# Patient Record
Sex: Male | Born: 1945 | Race: Black or African American | Hispanic: No | Smoking: Former smoker
Health system: Southern US, Community
[De-identification: ages and names within clinical notes are randomized; demographics above are authoritative.]

## PROBLEM LIST (undated history)

## (undated) DIAGNOSIS — N200 Calculus of kidney: Secondary | ICD-10-CM

## (undated) DIAGNOSIS — I10 Essential (primary) hypertension: Secondary | ICD-10-CM

## (undated) HISTORY — PX: CHOLECYSTECTOMY: SHX55

## (undated) HISTORY — DX: Morbid (severe) obesity due to excess calories: E66.01

## (undated) HISTORY — DX: Essential (primary) hypertension: I10

## (undated) HISTORY — DX: Calculus of kidney: N20.0

---

## 2007-04-13 ENCOUNTER — Ambulatory Visit: Payer: Self-pay | Admitting: Internal Medicine

## 2008-04-11 ENCOUNTER — Inpatient Hospital Stay: Payer: Self-pay | Admitting: Surgery

## 2009-04-19 DIAGNOSIS — R0602 Shortness of breath: Secondary | ICD-10-CM

## 2009-04-29 ENCOUNTER — Ambulatory Visit: Payer: Self-pay | Admitting: Internal Medicine

## 2009-04-29 DIAGNOSIS — E785 Hyperlipidemia, unspecified: Secondary | ICD-10-CM

## 2009-04-29 DIAGNOSIS — R609 Edema, unspecified: Secondary | ICD-10-CM | POA: Insufficient documentation

## 2009-04-29 DIAGNOSIS — I10 Essential (primary) hypertension: Secondary | ICD-10-CM | POA: Insufficient documentation

## 2009-04-29 DIAGNOSIS — E782 Mixed hyperlipidemia: Secondary | ICD-10-CM | POA: Insufficient documentation

## 2009-04-29 DIAGNOSIS — R079 Chest pain, unspecified: Secondary | ICD-10-CM | POA: Insufficient documentation

## 2009-04-29 LAB — CONVERTED CEMR LAB
CO2: 22 meq/L (ref 19–32)
Calcium: 9.4 mg/dL (ref 8.4–10.5)
Sodium: 139 meq/L (ref 135–145)

## 2009-04-30 ENCOUNTER — Encounter: Payer: Self-pay | Admitting: Internal Medicine

## 2009-05-07 ENCOUNTER — Encounter: Payer: Self-pay | Admitting: Cardiovascular Disease

## 2009-05-07 ENCOUNTER — Encounter: Payer: Self-pay | Admitting: Internal Medicine

## 2009-05-07 ENCOUNTER — Ambulatory Visit: Payer: Self-pay | Admitting: Internal Medicine

## 2009-05-07 ENCOUNTER — Ambulatory Visit: Payer: Self-pay

## 2009-07-01 ENCOUNTER — Ambulatory Visit: Payer: Self-pay | Admitting: Cardiovascular Disease

## 2009-08-05 ENCOUNTER — Telehealth: Payer: Self-pay | Admitting: Cardiovascular Disease

## 2009-08-06 ENCOUNTER — Ambulatory Visit: Payer: Self-pay | Admitting: Cardiovascular Disease

## 2009-08-09 LAB — CONVERTED CEMR LAB
ALT: 16 units/L (ref 0–53)
AST: 16 units/L (ref 0–37)
Alkaline Phosphatase: 77 units/L (ref 39–117)
Cholesterol: 117 mg/dL (ref 0–200)
Indirect Bilirubin: 0.4 mg/dL (ref 0.0–0.9)
Total Protein: 7.4 g/dL (ref 6.0–8.3)
Triglycerides: 90 mg/dL (ref <150)

## 2009-08-30 ENCOUNTER — Ambulatory Visit: Payer: Self-pay | Admitting: Otolaryngology

## 2009-09-19 ENCOUNTER — Ambulatory Visit: Payer: Self-pay | Admitting: Otolaryngology

## 2009-11-06 ENCOUNTER — Telehealth: Payer: Self-pay | Admitting: Cardiovascular Disease

## 2010-02-11 NOTE — Assessment & Plan Note (Signed)
Summary: F2M/AMD   Visit Type:  Follow-up Primary Provider:  Toy Cookey  CC:  no complaints.  History of Present Illness: 65 y/o male with h/o morbid obesity, HTN, diabetes (x 12 years), former smoker and GERD, h/o chest pain, Who completed a treadmill test, achieved 160 beats per minute with no significant ST or T wave changes, negative for ischemia.  He also had an echocardiogram that showed normal systolic function, no significant valvular disease. He does have diastolic dysfunction.  He reports that he continues to have lower extremity edema. He takes Lasix quite frequently but not every day for his edema and swelling in his abdomen. He wonders if one of the blood pressure medications,valturna, may be causing the abdominal swelling. He does drink a significant amount of water during the day as he works in a warehouse and sweats all day long.  Snores loudly. No witnessed apnea. Never been tested for OSA.    Current Medications (verified): 1)  Valturna 150-160 Mg Tabs (Aliskiren-Valsartan) .... Take 1 By Mouth Once Daily 2)  Triamterene-Hctz 37.5-25 Mg Tabs (Triamterene-Hctz) .... Take 1 By Mouth Once Daily 3)  Glipizide 10 Mg Xr24h-Tab (Glipizide) .... Take 1 By Mouth Once Daily 4)  Probenecid 500 Mg Tabs (Probenecid) .... Take 1 By Mouth Two Times A Day 5)  Indomethacin 50 Mg Caps (Indomethacin) .... Take 1 By Mouth Three Times A Day 6)  Lasix 20 Mg Tabs (Furosemide) .... Take 1 By Mouth Once Daily As Needed 7)  Potassium Chloride Crys Cr 20 Meq Cr-Tabs (Potassium Chloride Crys Cr) .... Take One Tablet By Mouth Daily As Needed 8)  Zocor 20 Mg Tabs (Simvastatin) .... Take 1 By Mouth Once Daily 9)  Aspirin 81 Mg Tbec (Aspirin) .... Take One Tablet By Mouth Daily  Allergies (verified): 1)  ! Sulfa  Review of Systems       The patient complains of peripheral edema.  The patient denies fever, weight loss, weight gain, vision loss, decreased hearing, hoarseness, chest pain,  syncope, dyspnea on exertion, prolonged cough, abdominal pain, incontinence, muscle weakness, depression, and enlarged lymph nodes.         ABD swelling  Vital Signs:  Patient profile:   65 year old male Height:      74 inches Weight:      304 pounds BMI:     39.17 Pulse rate:   64 / minute BP sitting:   130 / 86  (left arm) Cuff size:   large  Vitals Entered By: Bishop Dublin, CMA (July 01, 2009 9:26 AM)  Physical Exam  General:  Well developed, well nourished, in no acute distress. Head:  normocephalic and atraumatic Neck:  Neck supple, no JVD. No masses, thyromegaly or abnormal cervical nodes. Chest Wall:  no deformities or breast masses noted Lungs:  Clear bilaterally to auscultation and percussion. Heart:  Non-displaced PMI, chest non-tender; regular rate and rhythm, S1, S2 without murmurs, rubs or gallops. Carotid upstroke normal, no bruit.  Pedals normal pulses. No edema, no varicosities. Abdomen:  Bowel sounds positive; abdomen soft and non-tender without masses Msk:  Back normal, normal gait. Muscle strength and tone normal. Pulses:  pulses normal in all 4 extremities Extremities:  No clubbing or cyanosis. Neurologic:  Alert and oriented x 3. Skin:  Intact without lesions or rashes. Psych:  Normal affect.   Impression & Recommendations:  Problem # 1:  EDEMA (ICD-782.3) he continues to have lower extremity edema. I believe this is likely due to mild fluid  overload and possibly the hot weather with mild venous insufficiency. He does drink a significant amount of fluids and I suggested he cut back on his fluids or continue to take his Lasix daily. If he has a high salt and fluid intake on a particular day, he may need Lasix b.i.d. and we will change his prescription.  Problem # 2:  HYPERTENSION, BENIGN (ICD-401.1) his blood pressure is well-controlled and we have given him a co-pay card and some samples of valturna as he reports this was very expensive for him. If he  continues to have abdominal discomfort and he believes it is due to his medication, we could try an alternate medication.  His updated medication list for this problem includes:    Valturna 300-320 Mg Tabs (Aliskiren-valsartan) .Marland Kitchen... Take 1/2-1 tablet by mouth once a day    Triamterene-hctz 37.5-25 Mg Tabs (Triamterene-hctz) .Marland Kitchen... Take 1 by mouth once daily    Lasix 20 Mg Tabs (Furosemide) .Marland Kitchen... Take 1 by mouth once daily as needed    Aspirin 81 Mg Tbec (Aspirin) .Marland Kitchen... Take one tablet by mouth daily  Problem # 3:  HYPERLIPIDEMIA-MIXED (ICD-272.4) He has been on Zocor for 2 months. I suggested we check his cholesterol at the end of July with an LFTs.   His updated medication list for this problem includes:    Zocor 20 Mg Tabs (Simvastatin) .Marland Kitchen... Take 1 by mouth once daily  Patient Instructions: 1)  Your physician has recommended you make the following change in your medication: STOP Valturna 150/160 START Valturna 300/320 take 1/2 tab daily, Lasix 20 two times a day as needed for swelling and Sob, Potassium two times a day as needed when taking lasix 2)  Your physician recommends that you return for a FASTING lipid profile: In July (lipid/liver) 3)  Your physician wants you to follow-up in:   6 months You will receive a reminder letter in the mail two months in advance. If you don't receive a letter, please call our office to schedule the follow-up appointment. Prescriptions: VALTURNA 300-320 MG TABS (ALISKIREN-VALSARTAN) Take 1/2-1 tablet by mouth once a day  #30 x 6   Entered by:   Benedict Needy, RN   Authorized by:   Dossie Arbour MD   Signed by:   Benedict Needy, RN on 07/01/2009   Method used:   Print then Give to Patient   RxID:   [id number]

## 2010-02-11 NOTE — Progress Notes (Signed)
Summary: lab work  Phone Note Call from Patient   Summary of Call: Pt called and said he was told by Dr. Mariah Milling to drop by any day and get a tube of blood drawn.  Planning to come tomorrow.  Does he need to be fasting?  Not on the schedule for tomorrow. Initial call taken by: Park Breed,  August 05, 2009 10:18 AM  Follow-up for Phone Call        pt needs fasting lipid and liver appointment scheduled for 7/26 at 8:30am  Follow-up by: Benedict Needy, RN,  August 05, 2009 10:52 AM

## 2010-02-11 NOTE — Progress Notes (Signed)
Summary: MEDICATION PROBLEMS  Phone Note Call from Patient Call back at Home Phone [phone] Call back at [phone]   Caller: SELF Call For: [facility] Summary of Call: PT IS HAVING MUSCLE ACHES AND SORENESS WITH THE SIMVASTATIN-PT STATES THAT HE IS HAVING DIFFICULTY WALKING Initial call taken by: Harlon Flor,  November 06, 2009 3:54 PM  Follow-up for Phone Call        Pt c/o soreness legs and upper arms. Pt would like to change medications. Please advise. Benedict Needy, RN  November 06, 2009 4:45 PM   Additional Follow-up for Phone Call Additional follow up Details #1::        Could cut simvastatin in 1/2. Could also try crestor 5 mg daily     Appended Document: MEDICATION PROBLEMS    Clinical Lists Changes  Medications: Changed medication from ZOCOR 20 MG TABS (SIMVASTATIN) Take 1 by mouth once daily to ZOCOR 20 MG TABS (SIMVASTATIN) Take 1/2  by mouth once daily

## 2010-02-11 NOTE — Assessment & Plan Note (Signed)
Summary: NP6/AMD   Primary Provider:  Toy Cookey  CC:  Chest pressure x 1 week ago.  History of Present Illness: 65 y/o male with h/o morbid obesity, HTN, diabetes (x 12 years), former smoker and GERD.  Referred for further evaluation of chest pressure.   No h/o known CAD. Never had a stress test or cath.  No signifcant family h/o heart disease.  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  About a week ago had an episode of chest tightness at work while lifting. Lasted most of a day. No assoicated symptoms. Doesn't think he pulled a muscle. Has not recurred. Occasionally SOB when around smokers but otherwise fine. Also reports swelling in face, hands, stomach and legs. Started on maxide without much relief so sometimes takes one of his wife's lasix pills. No orthopnea or PND. Snores loudly. No witnessed apnea. Never been tested for OSA.    Current Medications (verified): 1)  Valturna 150-160 Mg Tabs (Aliskiren-Valsartan) .... Take 1 By Mouth Once Daily 2)  Triamterene-Hctz 37.5-25 Mg Tabs (Triamterene-Hctz) .... Take 1 By Mouth Once Daily 3)  Glipizide 10 Mg Xr24h-Tab (Glipizide) .... Take 1 By Mouth Once Daily 4)  Probenecid 500 Mg Tabs (Probenecid) .... Take 1 By Mouth Two Times A Day 5)  Indomethacin 50 Mg Caps (Indomethacin) .... Take 1 By Mouth Three Times A Day 6)  Aleve 220 Mg Tabs (Naproxen Sodium) .... Take 1 By Mouth As Needed  Allergies (verified): 1)  ! Sulfa  Past History:  Family History: Last updated: 05-26-2009 M died 94 due to possible aneurysm F died 21 due to HTN and renal failure 2 S and 2 B no known CAD  Social History: Last updated: 2009/05/26 Tobacco Use - Former. 1/2 ppd x 15 yrs quit 90s Alcohol Use - yes Drug Use - no Married  Risk Factors: Smoking Status: quit (04/19/2009)  Past Medical History: Diabetes Gout Nephrolithiasis Morbid Obesity HTN  Family History: Reviewed history from  04/19/2009 and no changes required. M died 3 due to possible aneurysm F died 16 due to HTN and renal failure 2 S and 2 B no known CAD  Social History: Reviewed history from 04/19/2009 and no changes required. Tobacco Use - Former. 1/2 ppd x 15 yrs quit 90s Alcohol Use - yes Drug Use - no Married  Review of Systems       As per HPI and past medical history; otherwise all systems negative.   Vital Signs:  Patient profile:   65 year old male Height:      74 inches Weight:      298 pounds BMI:     38.40 Pulse rate:   78 / minute BP sitting:   142 / 84  (right arm) Cuff size:   large  Vitals Entered By: Stanton Kidney, EMT-P (May 26, 2009 3:09 PM)  Physical Exam  General:  Gen: well appearing. no resp difficulty HEENT: normal Neck: thick. supple. hard to assess JVD. Carotids 2+ bilat; no bruits. No lymphadenopathy or thryomegaly appreciated. Cor: PMI nonpalpable. Regular rate & rhythm. No rubs, gallops, murmur. Lungs: clear Abdomen: obese soft, nontender, nondistended. . No bruits or masses. Good bowel sounds. Extremities: no cyanosis, clubbing, rash, 1-2+ edema Neuro: alert & orientedx3, cranial nerves grossly intact. moves all 4 extremities w/o difficulty. affect pleasant    Impression & Recommendations:  Problem # 1:  CHEST TIGHTNESS-PRESSURE-OTHER ([id number]) Given risk factors and exertional nature of symptoms I  am concerned about angina but fortunatley symptoms have not recurred. Will schedule ETT to further evaluate. Also start ECASA 81 once daily.  Problem # 2:  EDEMA (ICD-782.3) Suspect may have component of diastolic dysfunction. Check echo, BMET and BNP. Will give lasix 20mg /kcl 20 to take as needed. Needs to watch salt. Consider sleep study down the road.  Problem # 3:  HYPERLIPIDEMIA-MIXED (ICD-272.4)  Given DM2, goal LDL < 70. Will need statin. Start zocor 20. F/u PCP.   His updated medication list for this problem includes:    Zocor 20 Mg Tabs  (Simvastatin) .Marland Kitchen... Take 1 by mouth once daily  Problem # 4:  HYPERTENSION, BENIGN (ICD-401.1)  Remains mildly elevated. May need to add amlodipine down the road.  His updated medication list for this problem includes:    Valturna 150-160 Mg Tabs (Aliskiren-valsartan) .Marland Kitchen... Take 1 by mouth once daily    Triamterene-hctz 37.5-25 Mg Tabs (Triamterene-hctz) .Marland Kitchen... Take 1 by mouth once daily    Lasix 20 Mg Tabs (Furosemide) .Marland Kitchen... Take 1 by mouth once daily as needed    Aspirin 81 Mg Tbec (Aspirin) .Marland Kitchen... Take one tablet by mouth daily  Other Orders: T-Basic Metabolic Panel [id number]) T-BNP  (B Natriuretic Peptide) [phone]) EKG w/ Interpretation (93000) Treadmill (Treadmill) EKG w/ Interpretation (93000) Echocardiogram (Echo)  Patient Instructions: 1)  Your physician recommends that you schedule a follow-up appointment in: 2 Months 2)  Your physician has recommended you make the following change in your medication: Take an Aspirin 81 mg every day, Start Zocor 20 mg every day, Take Lasix (furosemide) 20 mg as needed for swelling, and take Potassium 20 mg as needed (when you take Lasix). 3)  Your physician has requested that you have an exercise tolerance test.  For further information please visit [email or web address]  Please also follow instruction sheet, as given. 4)  Scheduled Tuesday April 26th, be at [facility] for registration at 9:00 am. 5)  Your physician has requested that you have an echocardiogram.  Echocardiography is a painless test that uses sound waves to create images of your heart. It provides your doctor with information about the size and shape of your heart and how well your heart's chambers and valves are working.  This procedure takes approximately one hour. There are no restrictions for this procedure. Prescriptions: ZOCOR 20 MG TABS (SIMVASTATIN) Take 1 by mouth once daily  #30 x 11   Entered by:   Stanton Kidney, EMT-P   Authorized by:   Dolores Patty,  MD, [facility]   Signed by:   Charlena Cross, RN, BSN on 04/29/2009   Method used:   Electronically to        [id number] * (retail)       [address]       [place], [place]  [address]       Ph: [phone] or [phone]       Fax: [phone]   RxID:   [id number] LASIX 20 MG TABS (FUROSEMIDE) Take 1 by mouth once daily as needed  #30 x 0   Entered by:   Stanton Kidney, EMT-P   Authorized by:   Dolores Patty, MD, [facility]   Signed by:   Charlena Cross, RN, BSN on 04/29/2009   Method used:   Electronically to        [facility] [phone] * (retail)       [address]       [place], [place]  [address]       Ph: [phone] or [phone]       Fax: [phone]   RxID:   [id number] POTASSIUM CHLORIDE CRYS CR 20 MEQ CR-TABS (POTASSIUM CHLORIDE CRYS CR) Take one tablet by mouth daily as needed  #30 x 0   Entered by:   Stanton Kidney, EMT-P   Authorized by:   Dolores Patty, MD, [facility]   Signed by:   Charlena Cross, RN, BSN on 04/29/2009   Method used:   Electronically to        [id number] (retail)       [address]       [place], [place]  [address]       Ph: [phone] or [phone]       Fax: [phone]   RxID:   [id number]

## 2010-02-11 NOTE — Progress Notes (Signed)
Summary: PHI  PHI   Imported By: Harlon Flor 05/01/2009 12:30:10  _____________________________________________________________________  External Attachment:    Type:   Image     Comment:   External Document

## 2010-07-31 ENCOUNTER — Encounter: Payer: Self-pay | Admitting: Cardiovascular Disease

## 2010-11-18 ENCOUNTER — Ambulatory Visit: Payer: Self-pay | Admitting: Internal Medicine

## 2012-08-25 ENCOUNTER — Emergency Department: Payer: Self-pay | Admitting: Emergency Medicine

## 2013-05-15 ENCOUNTER — Ambulatory Visit (INDEPENDENT_AMBULATORY_CARE_PROVIDER_SITE_OTHER): Payer: Medicare Other | Admitting: Interventional Cardiology

## 2013-05-15 ENCOUNTER — Encounter: Payer: Self-pay | Admitting: Interventional Cardiology

## 2013-05-15 VITALS — BP 154/92 | HR 71 | Ht 74.0 in | Wt 306.0 lb

## 2013-05-15 DIAGNOSIS — R0602 Shortness of breath: Secondary | ICD-10-CM

## 2013-05-15 DIAGNOSIS — R609 Edema, unspecified: Secondary | ICD-10-CM

## 2013-05-15 DIAGNOSIS — I10 Essential (primary) hypertension: Secondary | ICD-10-CM

## 2013-05-15 LAB — BASIC METABOLIC PANEL
BUN: 16 mg/dL (ref 6–23)
CHLORIDE: 105 meq/L (ref 96–112)
CO2: 26 mEq/L (ref 19–32)
Calcium: 8.9 mg/dL (ref 8.4–10.5)
Creatinine, Ser: 1.1 mg/dL (ref 0.4–1.5)
GFR: 70.02 mL/min (ref >60.00)
Glucose, Bld: 132 mg/dL — ABNORMAL HIGH (ref 70–99)
POTASSIUM: 3.9 meq/L (ref 3.5–5.1)
SODIUM: 138 meq/L (ref 135–145)

## 2013-05-15 LAB — BRAIN NATRIURETIC PEPTIDE: PRO B NATRI PEPTIDE: 22 pg/mL (ref 0.0–100.0)

## 2013-05-15 MED ORDER — FUROSEMIDE 40 MG PO TABS: ORAL_TABLET | ORAL | Status: AC

## 2013-05-15 MED ORDER — FUROSEMIDE 40 MG PO TABS: ORAL_TABLET | ORAL | Status: DC

## 2013-05-15 NOTE — Progress Notes (Signed)
Patient ID: Garrett Frank, male   DOB: 09/04/1945, 68 y.o.   MRN: [id number]     Patient ID: Garrett Frank MRN: [id number] DOB/AGE: 68/04/1945 68 y.o.   Referring Physician Dr. Mayford KnifeWilliams   Reason for Consultation : RF for CAD  HPI: 68 y/o who has RF for CAD including HTN, hyperlipidemia and DM.  He is here for f/u.  He has had swelling in his legs, face and eye.  He has been  On a low dose of Lasix.  He denies any current chest discomfort. He has had some intermittent shortness of breath. He thinks he had a stress test and echocardiogram with Dr. Juliann Paresallwood, perhaps about a year ago. We do not have those results at this time.  When he had swelling in his face, there is concern for angioedema. Per the notes from his primary care physician, there is some medication changes made but no change in his symptoms occurred. He currently is on an ARB. He notes that he took his cetirizine and had some shortness of breath and heaviness one day. He stopped this medicine and the symptoms have not returned. He does not exercise regularly outside of work. His work however he is quite strenuous. 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  He tries to minimize salt in his diet.   Current Outpatient Prescriptions  Medication Sig Dispense Refill  . amLODipine (NORVASC) 10 MG tablet Take 10 mg by mouth daily.      Marland Kitchen. aspirin 81 MG EC tablet Take 81 mg by mouth daily.        Marland Kitchen. atorvastatin (LIPITOR) 40 MG tablet Take 40 mg by mouth daily. Take 20mg   At night due muscle cramps      . cetirizine (ZYRTEC) 10 MG tablet Take 10 mg by mouth daily.      . fluticasone (FLONASE) 50 MCG/ACT nasal spray Place into both nostrils daily.      . furosemide (LASIX) 20 MG tablet Take 20 mg by mouth daily.        Marland Kitchen. glipiZIDE (GLUCOTROL) 10 MG tablet Take 10 mg by mouth daily.        . Insulin Detemir (LEVEMIR [place]) Inject into the skin. 73 units      . LOSARTAN POTASSIUM PO Take 50 mg by mouth.      . meloxicam  (MOBIC) 15 MG tablet Take 15 mg by mouth daily.      . potassium chloride SA (K-DUR,KLOR-CON) 20 MEQ tablet Take 20 mEq by mouth daily.        . probenecid (BENEMID) 500 MG tablet Take 500 mg by mouth 2 (two) times daily.         No current facility-administered medications for this visit.   Past Medical History  Diagnosis Date  . Diabetes mellitus   . Gout   . Nephrolithiasis   . Morbid obesity   . HTN (hypertension)     Family History  Problem Relation Age of Onset  . Aneurysm Mother [id number]  . Hypertension Father   . Kidney failure Father   . Coronary artery disease Neg Hx     History   Social History  . Marital Status: Widowed    Spouse Name: N/A    Number of Children: N/A  . Years of Education: N/A   Occupational History  . Not on file.   Social History Main Topics  . Smoking status: Former Smoker -- 0.50 packs/day for 15 years  Types: Cigarettes  . Smokeless tobacco: Not on file  . Alcohol Use: Yes  . Drug Use: No  . Sexual Activity: Not on file   Other Topics Concern  . Not on file   Social History Narrative  . No narrative on file    No past surgical history on file.    (Not in a hospital admission)  Review of systems complete and found to be negative unless listed above .  No nausea, vomiting.  No fever chills, No focal weakness,  No palpitations.  Shortness of breath and edema as noted above.  Physical Exam: Filed Vitals:   05/15/13 0915  BP: 154/92  Pulse: 71    Weight: 306 lb (138.801 kg)  Physical exam:  [place]/AT EOMI No JVD, No carotid bruit RRR S1S2  No wheezing Soft. NT, nondistended Bilateral LE edema. No focal motor or sensory deficits Normal affect  Labs:   No results found for this basename: WBC, HGB, HCT, MCV, PLT   No results found for this basename: NA, K, CL, CO2, BUN, CREATININE, CALCIUM, LABALBU, PROT, BILITOT, ALKPHOS, ALT, AST, GLUCOSE,  in the last 168 hours No results found for this basename: CKTOTAL, CKMB, CKMBINDEX,  TROPONINI    Lab Results  Component Value Date   CHOL 117 08/06/2009   Lab Results  Component Value Date   HDL 31* 08/06/2009   Lab Results  Component Value Date   LDLCALC 68 08/06/2009   Lab Results  Component Value Date   TRIG 90 08/06/2009   Lab Results  Component Value Date   CHOLHDL 3.8 Ratio 08/06/2009   No results found for this basename: LDLDIRECT       EKG: NSR, LVH, NSST  ASSESSMENT AND PLAN:  1)SHOB: Intermittent. He does appear to be fluid overloaded. Will check electrolytes and BNP. Will increase Lasix to 40 mg daily. Will recheck bmet in a week to see if supplemental potassium as needed.  Will obtain stress test and echocardiogram results from the testing he had done in [place].  EF 60% in 2011.  2) HTN: He did not take his medicines today. Blood pressure has been in the 130s over 70s previously. Ideally, given his diabetes, his systolic would be less than 1:30 and diastolic less than 80.  3) Swelling:  He has been on his ARB. No significant change in his facial swelling. Apparently, medication changes were done due to concern for angioedema but there is no change. Continue increase diuretic. Will followup.  We went over the signs and symptoms of coronary ischemia. If these symptoms occur, he needs to let us know the   Signed:   Fredric MareJay S. Raniya Golembeski, MD, [facility] 05/15/2013, 9:46 AM

## 2013-05-15 NOTE — Patient Instructions (Signed)
Your physician has recommended you make the following change in your medication:   1. Increase Lasix to 40 mg daily.   Your physician recommends that you return for lab work today for BNP and BMET.  Your physician recommends that you return for lab work on 05/22/13 for BMET.  Your physician recommends that you schedule a follow-up appointment in: 3 months with Dr. Eldridge DaceVARANASI.

## 2013-05-22 ENCOUNTER — Other Ambulatory Visit: Payer: 59

## 2013-05-22 ENCOUNTER — Other Ambulatory Visit (INDEPENDENT_AMBULATORY_CARE_PROVIDER_SITE_OTHER): Payer: Medicare Other

## 2013-05-22 DIAGNOSIS — I10 Essential (primary) hypertension: Secondary | ICD-10-CM

## 2013-05-22 DIAGNOSIS — R0602 Shortness of breath: Secondary | ICD-10-CM

## 2013-05-22 LAB — BASIC METABOLIC PANEL
BUN: 17 mg/dL (ref 6–23)
CHLORIDE: 106 meq/L (ref 96–112)
CO2: 26 mEq/L (ref 19–32)
CREATININE: 1.3 mg/dL (ref 0.4–1.5)
Calcium: 9.2 mg/dL (ref 8.4–10.5)
GFR: 59.4 mL/min — ABNORMAL LOW (ref >60.00)
Glucose, Bld: 118 mg/dL — ABNORMAL HIGH (ref 70–99)
POTASSIUM: 3.8 meq/L (ref 3.5–5.1)
Sodium: 138 mEq/L (ref 135–145)

## 2013-08-28 ENCOUNTER — Encounter: Payer: Self-pay | Admitting: Interventional Cardiology

## 2013-08-28 ENCOUNTER — Other Ambulatory Visit: Payer: Self-pay | Admitting: *Deleted

## 2013-08-28 ENCOUNTER — Ambulatory Visit (INDEPENDENT_AMBULATORY_CARE_PROVIDER_SITE_OTHER): Payer: Medicare Other | Admitting: Interventional Cardiology

## 2013-08-28 VITALS — BP 152/90 | HR 92 | Ht 75.0 in | Wt 304.0 lb

## 2013-08-28 DIAGNOSIS — IMO0001 Reserved for inherently not codable concepts without codable children: Secondary | ICD-10-CM

## 2013-08-28 DIAGNOSIS — M791 Myalgia, unspecified site: Secondary | ICD-10-CM

## 2013-08-28 DIAGNOSIS — R609 Edema, unspecified: Secondary | ICD-10-CM

## 2013-08-28 DIAGNOSIS — I10 Essential (primary) hypertension: Secondary | ICD-10-CM

## 2013-08-28 NOTE — Progress Notes (Signed)
Patient ID: Garrett Frank, male   DOB: July 26, 1945, 68 y.o.   MRN: [id number] Patient ID: Garrett Frank, male   DOB: 04-17-1945, 68 y.o.   MRN: [id number]     Patient ID: Garrett Frank MRN: [id number] DOB/AGE: Nov 29, 1945 68 y.o.   Referring Physician Dr. Mayford Knife   Reason for Consultation : RF for CAD  HPI: 22 y/o who has RF for CAD including HTN, hyperlipidemia and DM.  SHOB better on higher dose of diuretic.  When he had swelling in his face, there was concern for angioedema. Per the notes from his primary care physician, there is some medication changes made but no change in his symptoms occurred. He currently is on an ARB. He notes that he took his cetirizine and had some shortness of breath and heaviness one day. He stopped this medicine and the symptoms have not returned. He does not exercise regularly outside of work. His work however he is quite strenuous. 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  He tries to minimize salt in his diet.  Not checking BP at home.  No CP or SHOB.   Current Outpatient Prescriptions  Medication Sig Dispense Refill  . amLODipine (NORVASC) 10 MG tablet Take 10 mg by mouth daily.      Marland Kitchen aspirin 81 MG EC tablet Take 81 mg by mouth daily.        Marland Kitchen atorvastatin (LIPITOR) 40 MG tablet Take 40 mg by mouth daily. Take 20mg   At night due muscle cramps      . fluticasone (FLONASE) 50 MCG/ACT nasal spray Place into both nostrils daily.      . furosemide (LASIX) 40 MG tablet 1 tablet daily.  30 tablet  6  . glipiZIDE (GLUCOTROL) 10 MG tablet Take 10 mg by mouth daily.        . Insulin Detemir (LEVEMIR [place]) Inject into the skin. 73 units      . losartan (COZAAR) 50 MG tablet Take 50 mg by mouth daily.      Marland Kitchen LOSARTAN POTASSIUM PO Take 50 mg by mouth.      . meloxicam (MOBIC) 15 MG tablet Take 15 mg by mouth daily.      . potassium chloride SA (K-DUR,KLOR-CON) 20 MEQ tablet Take 20 mEq by mouth daily.         No current facility-administered  medications for this visit.   Past Medical History  Diagnosis Date  . Diabetes mellitus   . Gout   . Nephrolithiasis   . Morbid obesity   . HTN (hypertension)     Family History  Problem Relation Age of Onset  . Aneurysm Mother 54  . Hypertension Father   . Kidney failure Father   . Coronary artery disease Neg Hx     History   Social History  . Marital Status: Widowed    Spouse Name: N/A    Number of Children: N/A  . Years of Education: N/A   Occupational History  . Not on file.   Social History Main Topics  . Smoking status: Former Smoker -- 0.50 packs/day for 15 years    Types: Cigarettes  . Smokeless tobacco: Not on file  . Alcohol Use: Yes  . Drug Use: No  . Sexual Activity: Not on file   Other Topics Concern  . Not on file   Social History Narrative  . No narrative on file    No past surgical history on file.    (  Not in a hospital admission)  Review of systems complete and found to be negative unless listed above .  No CP. No nausea, vomiting.  No fever chills, No focal weakness,  No palpitations.    Physical Exam: Filed Vitals:   08/28/13 1643  BP: 152/90  Pulse: 92    Weight: 304 lb (137.893 kg)  Physical exam:  [place]/AT EOMI No JVD, No carotid bruit RRR S1S2  No wheezing Soft. NT, nondistended Bilateral LE edema. No focal motor or sensory deficits Normal affect  Labs:   No results found for this basename: WBC,  HGB,  HCT,  MCV,  PLT   No results found for this basename: NA, K, CL, CO2, BUN, CREATININE, CALCIUM, LABALBU, PROT, BILITOT, ALKPHOS, ALT, AST, GLUCOSE,  in the last 168 hours No results found for this basename: CKTOTAL,  CKMB,  CKMBINDEX,  TROPONINI    Lab Results  Component Value Date   CHOL 117 08/06/2009   Lab Results  Component Value Date   HDL 31* 08/06/2009   Lab Results  Component Value Date   LDLCALC 68 08/06/2009   Lab Results  Component Value Date   TRIG 90 08/06/2009   Lab Results  Component Value Date    CHOLHDL 3.8 Ratio 08/06/2009   No results found for this basename: LDLDIRECT       EKG: NSR, LVH, NSST  ASSESSMENT AND PLAN:  1)SHOB: Resolved. He does not appear to be fluid overloaded. increased Lasix to 40 mg daily.  Negative stress test in 2012. Apparently normal EF by echocardiogram  [place].  EF 60% in 2011.  2) HTN: He did take his medicines today. Blood pressure has not been checked of late. Ideally, given his diabetes, his systolic would be less than 130 and diastolic less than 80.  Stop Mobic.  Check BP at home. Use acetaminophen for pain given possible interaction of Mobic with ARB.  3) Swelling:  He has been on his ARB. No significant change in his facial swelling above the eye (R>L). Apparently, medication changes were done due to concern for angioedema but there is no change. Continue increased diuretic.   Muscle aches on statins.  He wants to stop lipitor if possible.  Will check Vit D to see if this can be supplemented.   We went over the signs and symptoms of coronary ischemia. If these symptoms occur, he needs to let us know the   Signed:   Fredric MareJay S. Jailin Manocchio, MD, [facility] 08/28/2013, 5:07 PM

## 2013-08-28 NOTE — Patient Instructions (Addendum)
Your physician recommends that you return for lab work for Vitamin D level.  Your physician has recommended you make the following change in your medication:   1. Stop Mobic.  Your physician has requested that you regularly monitor and record your blood pressure readings at home. Please use the same machine at the same time of day to check your readings and record them. Call if Bp readings are consistenly above 140/90.  Your physician wants you to follow-up in: 1 year with Dr. Eldridge DaceVaranasi. You will receive a reminder letter in the mail two months in advance. If you don't receive a letter, please call our office to schedule the follow-up appointment.

## 2013-08-29 ENCOUNTER — Telehealth: Payer: Self-pay | Admitting: Interventional Cardiology

## 2013-08-29 NOTE — Telephone Encounter (Signed)
Please add "stop Mobic" to patient instructions from yesterday.  I told him this during the appt.

## 2013-08-29 NOTE — Telephone Encounter (Signed)
Done

## 2013-08-31 ENCOUNTER — Other Ambulatory Visit (INDEPENDENT_AMBULATORY_CARE_PROVIDER_SITE_OTHER): Payer: 59

## 2013-08-31 DIAGNOSIS — IMO0001 Reserved for inherently not codable concepts without codable children: Secondary | ICD-10-CM

## 2013-09-01 LAB — VITAMIN D 25 HYDROXY (VIT D DEFICIENCY, FRACTURES): VITD: 13.63 ng/mL — AB (ref 30.00–100.00)

## 2013-09-05 ENCOUNTER — Telehealth: Payer: Self-pay | Admitting: Cardiology

## 2013-09-05 DIAGNOSIS — E785 Hyperlipidemia, unspecified: Secondary | ICD-10-CM

## 2013-09-05 MED ORDER — VITAMIN D (ERGOCALCIFEROL) 1.25 MG (50000 UNIT) PO CAPS: 50000.0000 [IU] | ORAL_CAPSULE | ORAL | Status: DC

## 2013-09-05 NOTE — Telephone Encounter (Signed)
Message copied by Theda Sers on Tue Sep 05, 2013  5:01 PM ------      Message from: SMART, Gaspar Skeeters      Created: Mon Sep 04, 2013  1:22 PM       Patient with h/o CAD and on lipitor 20 mg daily but having muscle aches.  Wanted to stop lipitor if possible.  Vitamin D level very low.  Will need prescription Vitamin D supplementation with 50,000 IU weekly for 3 months.      Plan:      1.  Okay to stop Lipitor 20 mg for 4 weeks.      2.  Start Vitamin D 50,000 units once weekly - do this for 12 weeks.      3.  In 4 weeks from now, restart lipitor 20 mg once daily.  Hopefully muscle aches will be much less as Vitamin D level should be trending up.      4.  Recheck lipid panel / hepatic panel / and Vitamin D 25-OH level in 12 weeks.  Will reassess need for Vitamin D prescription vs OTC strength at that time.      Please notify patient, update meds, and set up labs. Thanks. ------

## 2013-09-05 NOTE — Telephone Encounter (Signed)
Pt notified, meds updated and labs ordered.  

## 2013-10-30 ENCOUNTER — Telehealth: Payer: Self-pay | Admitting: Interventional Cardiology

## 2013-10-30 DIAGNOSIS — E782 Mixed hyperlipidemia: Secondary | ICD-10-CM

## 2013-10-30 NOTE — Telephone Encounter (Signed)
I called the patient's close friend, Garrett Frank ([birth year]) with Dr. Hoyle BarrVaranasi's recommendations for a lipid clinic referral for herself. She states the patient is having trouble with lipitor and has been intolerant to a statin previously. She would like to know if he can be referred to the lipid clinic as well. The patient was talking with [name] in the background as I was speaking with her. I advised I would have to forward to Dr. Eldridge DaceVaranasi for his recommendations. It is ok with the patient if we call [name] back with recommendations. The number is [phone].

## 2013-10-31 NOTE — Telephone Encounter (Signed)
Ms. Garrett Frank is aware that Dr. Eldridge DaceVaranasi has given his ok for [name] to be referred to the lipid clinic.

## 2013-10-31 NOTE — Telephone Encounter (Signed)
OK to refer to the lipid clinic.

## 2013-11-05 ENCOUNTER — Emergency Department: Payer: Self-pay | Admitting: Internal Medicine

## 2013-11-09 ENCOUNTER — Ambulatory Visit: Payer: 59 | Admitting: Pharmacist

## 2013-11-09 ENCOUNTER — Other Ambulatory Visit (INDEPENDENT_AMBULATORY_CARE_PROVIDER_SITE_OTHER): Payer: Medicare Other | Admitting: *Deleted

## 2013-11-09 DIAGNOSIS — E785 Hyperlipidemia, unspecified: Secondary | ICD-10-CM

## 2013-11-10 LAB — LIPID PANEL
Cholesterol: 173 mg/dL (ref 0–200)
HDL: 36.3 mg/dL — ABNORMAL LOW (ref >39.00)
LDL Cholesterol: 122 mg/dL — ABNORMAL HIGH (ref 0–99)
NonHDL: 136.7
Total CHOL/HDL Ratio: 5
Triglycerides: 75 mg/dL (ref 0.0–149.0)
VLDL: 15 mg/dL (ref 0.0–40.0)

## 2013-11-10 LAB — HEPATIC FUNCTION PANEL
ALT: 13 U/L (ref 0–53)
AST: 19 U/L (ref 0–37)
Albumin: 3.1 g/dL — ABNORMAL LOW (ref 3.5–5.2)
Alkaline Phosphatase: 79 U/L (ref 39–117)
Bilirubin, Direct: 0.1 mg/dL (ref 0.0–0.3)
Total Bilirubin: 0.8 mg/dL (ref 0.2–1.2)
Total Protein: 6.9 g/dL (ref 6.0–8.3)

## 2013-11-10 LAB — VITAMIN D 25 HYDROXY (VIT D DEFICIENCY, FRACTURES): VITD: 30.77 ng/mL (ref 30.00–100.00)

## 2013-11-13 ENCOUNTER — Ambulatory Visit (INDEPENDENT_AMBULATORY_CARE_PROVIDER_SITE_OTHER): Payer: Medicare Other | Admitting: Pharmacist

## 2013-11-13 DIAGNOSIS — E785 Hyperlipidemia, unspecified: Secondary | ICD-10-CM

## 2013-11-14 NOTE — Progress Notes (Signed)
HPI  [name] is a 68 yo pt of Dr. Eldridge Frank who is referred to Lipid clinic for statin intolerance.  He has a history of DM and HTN but no ASCVD.  Family history is negative for CAD.  He does not smoke.  He has most recently been treated with Lipitor.  He was on 40mg  daily and began having myalgias.  His vitamin D level was low so his Lipitor was stopped for 4 weeks while vitamin D was corrected.  He started back on 20mg  daily but has had the same malagias despite vitamin D levels being normal.  He states he has tried other medications in the past but does not recall any of the names.    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    Dietary review showed pt eats out quite a bit.  He will grab 2 biscuits with sausage, bacon or ham with egg each day.  He eats one for breakfast and the other for lunch each day.  He does like oatmeal but vary rarely gets this from the restaurant.  His friend cooks dinner for him. He likes rotisserie chicken, fried chicken, pork chops, roast, lima beans, green peas corn and potatoes.  He will snack on M and Ms occasionally.    Goals: LDL<70, non-HDL <100  Labs:   10/2013: TC 173, TG 75, HDL 36, LDL 122, LFTs are WNL.  (on no therapy)   Current Outpatient Prescriptions  Medication Sig Dispense Refill  . hydrochlorothiazide (HYDRODIURIL) 25 MG tablet Take 25 mg by mouth daily.    Marland Kitchen. amLODipine (NORVASC) 10 MG tablet Take 10 mg by mouth daily.    Marland Kitchen. aspirin 81 MG EC tablet Take 81 mg by mouth daily.      . dorzolamide-timolol (COSOPT) 22.3-6.8 MG/ML ophthalmic solution Place 1 drop into both eyes 2 (two) times daily.    . fluticasone (FLONASE) 50 MCG/ACT nasal spray Place into both nostrils daily.    . furosemide (LASIX) 40 MG tablet 1 tablet daily. 30 tablet 6  . glipiZIDE (GLUCOTROL) 10 MG tablet Take 10 mg by mouth daily.      . Insulin Detemir (LEVEMIR [name]) Inject 80 Units into the skin at bedtime.     Marland Kitchen. latanoprost (XALATAN) 0.005 % ophthalmic  solution Place 1 drop into both eyes at bedtime.    Marland Kitchen. losartan (COZAAR) 100 MG tablet Take 1 tablet by mouth daily.    . metoprolol succinate (TOPROL-XL) 25 MG 24 hr tablet Take 1 tablet by mouth daily.    . potassium chloride SA (K-DUR,KLOR-CON) 20 MEQ tablet Take 20 mEq by mouth daily.      . Vitamin D, Ergocalciferol, (DRISDOL) 50000 UNITS CAPS capsule Take 1 capsule (50,000 Units total) by mouth every 7 (seven) days. 12 capsule 0   No current facility-administered medications for this visit.   Allergies  Allergen Reactions  . Sulfonamide Derivatives    Assessment and Plan 1.  Hyperlipidemia- We will attempt to get records from pt's PCP regarding past therapies.  Once we have gotten that information, will determine next step in therapy.

## 2013-12-11 ENCOUNTER — Other Ambulatory Visit: Payer: 59

## 2014-09-11 ENCOUNTER — Other Ambulatory Visit: Payer: Self-pay | Admitting: *Deleted

## 2014-09-11 ENCOUNTER — Encounter: Payer: Self-pay | Admitting: Interventional Cardiology

## 2014-09-11 ENCOUNTER — Ambulatory Visit (INDEPENDENT_AMBULATORY_CARE_PROVIDER_SITE_OTHER): Payer: 59 | Admitting: Interventional Cardiology

## 2014-09-11 VITALS — BP 140/90 | HR 71 | Ht 75.0 in | Wt 304.6 lb

## 2014-09-11 DIAGNOSIS — R0602 Shortness of breath: Secondary | ICD-10-CM

## 2014-09-11 DIAGNOSIS — E785 Hyperlipidemia, unspecified: Secondary | ICD-10-CM

## 2014-09-11 DIAGNOSIS — I10 Essential (primary) hypertension: Secondary | ICD-10-CM

## 2014-09-11 NOTE — Progress Notes (Signed)
Patient ID: Garrett Frank, male   DOB: 11-23-1945, 69 y.o.   MRN: [id number]     Cardiology Office Note   Date:  09/11/2014   ID:  Garrett, Frank 05/29/1945, MRN [id number]  PCP:  WHITE, Valentina Shaggy, FNP    No chief complaint on file.  Hyperlipidemia  Wt Readings from Last 3 Encounters:  09/11/14 304 lb 9.6 oz (138.166 kg)  08/28/13 304 lb (137.893 kg)  05/15/13 306 lb (138.801 kg)       History of Present Illness: Garrett Frank is a 69 y.o. male  who has RF for CAD including HTN, hyperlipidemia and DM.  He denies any chest discomfort or exertional shortness of breath. Overall, he is feeling well. He has been intolerant to several statin drugs in the past including Lipitor and pravastatin. He has never tried Crestor. His LDL and triglycerides were above target in the past. On most recent check in March 2016, his LDL had improved but his triglycerides are still elevated. He has been followed in our lipid clinic.  He has been most affected by gout recently.    Past Medical History  Diagnosis Date  . Diabetes mellitus   . Gout   . Nephrolithiasis   . Morbid obesity   . HTN (hypertension)     History reviewed. No pertinent past surgical history.   Current Outpatient Prescriptions  Medication Sig Dispense Refill  . amLODipine (NORVASC) 10 MG tablet Take 10 mg by mouth daily.    Marland Kitchen aspirin 81 MG EC tablet Take 81 mg by mouth daily.      Marland Kitchen atenolol (TENORMIN) 25 MG tablet Take 25 mg by mouth daily. for high blood pressure  1  . colchicine 0.6 MG tablet Take 0.6 mg by mouth daily. Take 2 tablets by mouth on 1st onset of GOUT flare, follow in 1 hour with 1 tablet (maximum 3 tablets in a hour)    . cyclobenzaprine (FLEXERIL) 5 MG tablet Take 5 mg by mouth 3 (three) times daily as needed for muscle spasms.    Marland Kitchen docusate sodium (COLACE) 100 MG capsule Take 100 mg by mouth daily as needed for mild constipation.    . dorzolamide-timolol (COSOPT) 22.3-6.8 MG/ML ophthalmic solution  Place 1 drop into both eyes 2 (two) times daily.    . furosemide (LASIX) 40 MG tablet 1 tablet daily. 30 tablet 6  . glipiZIDE (GLUCOTROL) 10 MG tablet Take 10 mg by mouth daily.      . Insulin Detemir (LEVEMIR [place]) Inject 58 Units into the skin 2 (two) times daily.     Marland Kitchen losartan (COZAAR) 100 MG tablet Take 1 tablet by mouth daily.    . meloxicam (MOBIC) 15 MG tablet TAKE 1 TABLET BY MOUTH DAILY FOR PAIN  2  . naproxen (NAPROSYN) 500 MG tablet Take 500 mg by mouth 2 (two) times daily as needed for mild pain (gout).    . ONE TOUCH ULTRA TEST test strip CHECK BLOOD SUGAR TWICE A DAY AS DIRECTED DX CODE: 250.02  11  . Vitamin D, Ergocalciferol, (DRISDOL) 50000 UNITS CAPS capsule Take 1 capsule (50,000 Units total) by mouth every 7 (seven) days. 12 capsule 0   No current facility-administered medications for this visit.    Allergies:   Sulfonamide derivatives    Social History:  The patient  reports that he has quit smoking. His smoking use included Cigarettes. He has a 7.5 pack-year smoking history. He does not have any smokeless tobacco  history on file. He reports that he drinks alcohol. He reports that he does not use illicit drugs.   Family History:  The patient's family history includes Aneurysm (age of onset: 28) in his mother; Hypertension in his father; Kidney failure in his father. There is no history of Coronary artery disease.    ROS:  Please see the history of present illness.   Otherwise, review of systems are positive for gout pain.   All other systems are reviewed and negative.    PHYSICAL EXAM: VS:  BP 140/90 mmHg  Pulse 71  Ht 6\' 3"  (1.905 m)  Wt 304 lb 9.6 oz (138.166 kg)  BMI 38.07 kg/m2 , BMI Body mass index is 38.07 kg/(m^2). GEN: Well nourished, well developed, in no acute distress HEENT: normal Neck: no JVD, carotid bruits, or masses Cardiac: RRR; no murmurs, rubs, or gallops,no edema  Respiratory:  clear to auscultation bilaterally, normal work of breathing GI:  soft, nontender, nondistended, + BS MS: no deformity or atrophy Skin: warm and dry, no rash Neuro:  Strength and sensation are intact Psych: euthymic mood, full affect   EKG:   The ekg ordered today demonstrates NSR, PACs, nonspecific ST segment changes   Recent Labs: 11/09/2013: ALT 13   Lipid Panel    Component Value Date/Time   CHOL 173 11/09/2013 1112   TRIG 75.0 11/09/2013 1112   HDL 36.30* 11/09/2013 1112   CHOLHDL 5 11/09/2013 1112   VLDL 15.0 11/09/2013 1112   LDLCALC 122* 11/09/2013 1112     Other studies Reviewed: Additional studies/ records that were reviewed today with results demonstrating: Prior lab results reviewed. LDL above 102,015..   ASSESSMENT AND PLAN:  1. Hyperlipidemia: Would like to try Crestor as his statin given that he is diabetic. His predominant issue seems to be more triglyceride related. Records given to the lipid clinic. Await further recommendations from them. 2. Hypertension: Blood pressure fairly well controlled today. Hopefully will further reduce with more lifestyle modifications. Ideally, would like to see his systolic below 1:30. 3. He should continue to try to lose weight through diet and exercise to help with other risk factor modification.  Mild shortness of breath when he does heavy exertion- unchanged. Continue to maintain active lifestyle. 4. Diabetes: Managed by primary care physician.   Current medicines are reviewed at length with the patient today.  The patient concerns regarding his medicines were addressed.  The following changes have been made:  No change  Labs/ tests ordered today include:   Orders Placed This Encounter  Procedures  . EKG 12-Lead    Recommend 150 minutes/week of aerobic exercise Low fat, low carb, high fiber diet recommended  Disposition:   FU with lipid clinic   Signed, Corky Crafts., MD  09/11/2014 5:41 PM    [facility] [address] [place], [place], [place]   [address] Phone: [phone]; Fax: [phone]

## 2014-09-11 NOTE — Patient Instructions (Signed)
**Note De-Identified Garrett Frank Obfuscation** Medication Instructions:  Same-no changes  Labwork: none  Testing/Procedures: none  Follow-Up: Your physician recommends that you schedule a follow-up appointment in: Lipid Clinic will contact you with date and time.

## 2015-05-29 ENCOUNTER — Encounter: Payer: Self-pay | Admitting: Interventional Cardiology

## 2015-08-10 ENCOUNTER — Emergency Department
Admission: EM | Admit: 2015-08-10 | Discharge: 2015-08-10 | Disposition: A | Discharge status: Home / Self Care | Payer: 59 | Attending: Emergency Medicine | Admitting: Emergency Medicine

## 2015-08-10 ENCOUNTER — Emergency Department: Payer: 59

## 2015-08-10 ENCOUNTER — Encounter: Payer: Self-pay | Admitting: Emergency Medicine

## 2015-08-10 DIAGNOSIS — T148XXA Other injury of unspecified body region, initial encounter: Secondary | ICD-10-CM

## 2015-08-10 DIAGNOSIS — Z794 Long term (current) use of insulin: Secondary | ICD-10-CM | POA: Insufficient documentation

## 2015-08-10 DIAGNOSIS — X500XXA Overexertion from strenuous movement or load, initial encounter: Secondary | ICD-10-CM | POA: Diagnosis not present

## 2015-08-10 DIAGNOSIS — Z87891 Personal history of nicotine dependence: Secondary | ICD-10-CM | POA: Insufficient documentation

## 2015-08-10 DIAGNOSIS — E785 Hyperlipidemia, unspecified: Secondary | ICD-10-CM | POA: Insufficient documentation

## 2015-08-10 DIAGNOSIS — Z7982 Long term (current) use of aspirin: Secondary | ICD-10-CM | POA: Diagnosis not present

## 2015-08-10 DIAGNOSIS — Y929 Unspecified place or not applicable: Secondary | ICD-10-CM | POA: Insufficient documentation

## 2015-08-10 DIAGNOSIS — S46912A Strain of unspecified muscle, fascia and tendon at shoulder and upper arm level, left arm, initial encounter: Secondary | ICD-10-CM | POA: Diagnosis not present

## 2015-08-10 DIAGNOSIS — Y990 Civilian activity done for income or pay: Secondary | ICD-10-CM | POA: Insufficient documentation

## 2015-08-10 DIAGNOSIS — E119 Type 2 diabetes mellitus without complications: Secondary | ICD-10-CM | POA: Diagnosis not present

## 2015-08-10 DIAGNOSIS — Y9389 Activity, other specified: Secondary | ICD-10-CM | POA: Insufficient documentation

## 2015-08-10 DIAGNOSIS — I10 Essential (primary) hypertension: Secondary | ICD-10-CM | POA: Diagnosis not present

## 2015-08-10 DIAGNOSIS — M79622 Pain in left upper arm: Secondary | ICD-10-CM | POA: Diagnosis present

## 2015-08-10 DIAGNOSIS — M79602 Pain in left arm: Secondary | ICD-10-CM

## 2015-08-10 LAB — CBC
HCT: 42 % (ref 40.0–52.0)
HEMOGLOBIN: 14.4 g/dL (ref 13.0–18.0)
MCH: 29.5 pg (ref 26.0–34.0)
MCHC: 34.4 g/dL (ref 32.0–36.0)
MCV: 85.8 fL (ref 80.0–100.0)
Platelets: 251 10*3/uL (ref 150–440)
RBC: 4.89 MIL/uL (ref 4.40–5.90)
RDW: 13.4 % (ref 11.5–14.5)
WBC: 11.4 10*3/uL — ABNORMAL HIGH (ref 3.8–10.6)

## 2015-08-10 LAB — BASIC METABOLIC PANEL
Anion gap: 7 (ref 5–15)
BUN: 12 mg/dL (ref 6–20)
CALCIUM: 9.1 mg/dL (ref 8.9–10.3)
CHLORIDE: 105 mmol/L (ref 101–111)
CO2: 26 mmol/L (ref 22–32)
CREATININE: 1.11 mg/dL (ref 0.61–1.24)
GFR calc Af Amer: >60 mL/min (ref >60)
GFR calc non Af Amer: >60 mL/min (ref >60)
GLUCOSE: 158 mg/dL — AB (ref 65–99)
Potassium: 4 mmol/L (ref 3.5–5.1)
Sodium: 138 mmol/L (ref 135–145)

## 2015-08-10 NOTE — ED Notes (Signed)
Patient transported to X-ray 

## 2015-08-10 NOTE — ED Notes (Signed)
One set of cultures sent with labs

## 2015-08-10 NOTE — Discharge Instructions (Signed)
As we discussed, your ultrasound and radiographs were reassuring today.  We believe that you have severely strained the muscles and tendons in your left arm due to your hard work.  We recommend that you take ibuprofen 600 mg 3 times a day with meals and that you schedule an appointment with an orthopedic surgeon such as Dr. Ernest Pine with whom you can follow-up.  If you develop new or worsening symptoms that concern you, such as worsening swelling, worsening pain, or loss of sensation in the affected arm, please return immediately to the emergency department.

## 2015-08-10 NOTE — ED Triage Notes (Signed)
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 Does not know of a specific injury. Pain is to his left elbow - some swelling noted to his hand as well.

## 2015-08-10 NOTE — ED Notes (Signed)
Pt in via triage with complaints of left arm pain.  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  Pt denies hearing any noise indicating injury at that time, states his pain did not begin until last night while he was at home.  Pt woke up this morning with swelling to that extremity.  Pt with limited ROM.  Pt reports pain only associated with movement.  Pt A/Ox4, no immediate distress at this time.

## 2015-08-10 NOTE — ED Notes (Signed)
Patient transported to Ultrasound 

## 2015-08-10 NOTE — ED Provider Notes (Signed)
[facility] Emergency Department Provider Note  ____________________________________________   First MD Initiated Contact with Patient 08/10/15 1647     (approximate)  I have reviewed the triage vital signs and the nursing notes.   HISTORY  Chief Complaint Arm Pain    HPI Garrett Frank is a 70 y.o. male who presents for evaluation of acute onset of pain in his left arm from the elbow down.  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  He performed his jobyesterday and does not remember a specific accident or traumatic incident.  However when he awoke this morning his arm was swollen from the elbow down to the hand, feels warm to the touch, and he is able to only minimally move it due to the pain.  This kind of thing is never happened to him before.  He states the pain is severe when he tries to flex his elbow or move his arm but the pain is not in his shoulder or left upper arm.  Pain is an aching and sharp pain and is better with rest.  He has no history of blood clots in the legs or the lungs or the extremities.  He denies fever/chills, chest pain, shortness of breath, nausea, vomiting, diarrhea.    Past Medical History:  Diagnosis Date  . Diabetes mellitus   . Gout   . HTN (hypertension)   . Morbid obesity (HCC)   . Nephrolithiasis     Patient Active Problem List   Diagnosis Date Noted  . Hyperlipidemia 04/29/2009  . HYPERTENSION, BENIGN 04/29/2009  . EDEMA 04/29/2009  . CHEST PAIN UNSPECIFIED 04/29/2009  . SHORTNESS OF BREATH 04/19/2009    Past Surgical History:  Procedure Laterality Date  . CHOLECYSTECTOMY      Prior to Admission medications   Medication Sig Start Date End Date Taking? Authorizing Provider  amLODipine (NORVASC) 10 MG tablet Take 10 mg by mouth daily.    Historical Provider, MD  aspirin 81 MG EC tablet Take 81 mg by  mouth daily.      Historical Provider, MD  atenolol (TENORMIN) 25 MG tablet Take 25 mg by mouth daily. for high blood pressure 08/30/14   Historical Provider, MD  colchicine 0.6 MG tablet Take 0.6 mg by mouth daily. Take 2 tablets by mouth on 1st onset of GOUT flare, follow in 1 hour with 1 tablet (maximum 3 tablets in a hour)    Historical Provider, MD  cyclobenzaprine (FLEXERIL) 5 MG tablet Take 5 mg by mouth 3 (three) times daily as needed for muscle spasms.    Historical Provider, MD  docusate sodium (COLACE) 100 MG capsule Take 100 mg by mouth daily as needed for mild constipation.    Historical Provider, MD  dorzolamide-timolol (COSOPT) 22.3-6.8 MG/ML ophthalmic solution Place 1 drop into both eyes 2 (two) times daily. 11/03/13   Historical Provider, MD  furosemide (LASIX) 40 MG tablet 1 tablet daily. 05/15/13   Corky Crafts, MD  glipiZIDE (GLUCOTROL) 10 MG tablet Take 10 mg by mouth daily.      Historical Provider, MD  GLIPIZIDE XL 10 MG 24 hr tablet Take 1 tablet by mouth daily. 07/30/15   Historical Provider, MD  hydrALAZINE (APRESOLINE) 50 MG tablet Take 1 tablet by mouth 3 (three) times daily. 08/07/15   Historical Provider, MD  Insulin Detemir (LEVEMIR Powers) Inject 58  Units into the skin 2 (two) times daily.     Historical Provider, MD  latanoprost (XALATAN) 0.005 % ophthalmic solution Place 1 drop into both eyes at bedtime. 07/11/15   Historical Provider, MD  losartan (COZAAR) 100 MG tablet Take 1 tablet by mouth daily. 11/01/13   Historical Provider, MD  meloxicam (MOBIC) 15 MG tablet TAKE 1 TABLET BY MOUTH DAILY FOR PAIN 06/15/14   Historical Provider, MD  metoprolol succinate (TOPROL-XL) 25 MG 24 hr tablet Take 1 tablet by mouth daily. 07/30/15   Historical Provider, MD  naproxen (NAPROSYN) 500 MG tablet Take 500 mg by mouth 2 (two) times daily as needed for mild pain (gout).    Historical Provider, MD  ONE TOUCH ULTRA TEST test strip CHECK BLOOD SUGAR TWICE A DAY AS DIRECTED DX CODE:  250.02 07/26/14   Historical Provider, MD  Vitamin D, Ergocalciferol, (DRISDOL) 50000 UNITS CAPS capsule Take 1 capsule (50,000 Units total) by mouth every 7 (seven) days. 09/05/13   Corky Crafts, MD    Allergies Sulfonamide derivatives  Family History  Problem Relation Age of Onset  . Aneurysm Mother 76  . Hypertension Father   . Kidney failure Father   . Coronary artery disease Neg Hx     Social History Social History  Substance Use Topics  . Smoking status: Former Smoker    Packs/day: 0.50    Years: 15.00    Types: Cigarettes  . Smokeless tobacco: Never Used  . Alcohol use Yes     Comment: ocassionally    Review of Systems Constitutional: No fever/chills Eyes: No visual changes. ENT: No sore throat. Cardiovascular: Denies chest pain. Respiratory: Denies shortness of breath. Gastrointestinal: No abdominal pain.  No nausea, no vomiting.  No diarrhea.  No constipation. Genitourinary: Negative for dysuria. Musculoskeletal: Pain, swelling, and warmth from the left elbow down to the hand Skin: Negative for rash. Neurological: Negative for headaches, focal weakness or numbness.  10-point ROS otherwise negative.  ____________________________________________   PHYSICAL EXAM:  VITAL SIGNS: ED Triage Vitals  Enc Vitals Group     BP 08/10/15 1456 (!) 171/91     Pulse Rate 08/10/15 1456 98     Resp 08/10/15 1456 18     Temp 08/10/15 1456 99.3 F (37.4 C)     Temp Source 08/10/15 1456 Oral     SpO2 08/10/15 1456 99 %     Weight 08/10/15 1456 297 lb (134.7 kg)     Height 08/10/15 1456  (1.88 m)     Head Circumference --      Peak Flow --      Pain Score 08/10/15 1457 0     Pain Loc --      Pain Edu? --      Excl. in GC? --     Constitutional: Alert and oriented. Well appearing and in no acute distress. Eyes: Conjunctivae are normal. PERRL. EOMI. Head: Atraumatic. Nose: No congestion/rhinnorhea. Mouth/Throat: Mucous membranes are moist.  Oropharynx  non-erythematous. Neck: No stridor.  No meningeal signs.   Cardiovascular: Normal rate, regular rhythm. Good peripheral circulation. Grossly normal heart sounds.   Respiratory: Normal respiratory effort.  No retractions. Lungs CTAB. Gastrointestinal: Obese.  Soft and nontender. No distention.  Musculoskeletal: The patient's left forearm and left hand are swollen and the compartments are firm but not hard.  He is able to wiggle his fingers.  He has highly reproducible tenderness/pain with trying to flex his elbow.  His left forearm and hand  are warm compared to the right.  No gross deformities are appreciated except as described previously.  The left upper arm and shoulder are normal.  No other musculoskeletal deformities are appreciated.  The left hand appears  Neurovascularly intact although he does describe some numbness when he tries to flex his elbow.   Neurologic:  Normal speech and language. No gross focal neurologic deficits are appreciated.  Skin:  Skin is warm, dry and intact. No rash noted. Psychiatric: Mood and affect are normal. Speech and behavior are normal.  ____________________________________________   LABS (all labs ordered are listed, but only abnormal results are displayed)  Labs Reviewed  CBC - Abnormal; Notable for the following:       Result Value   WBC 11.4 (*)    All other components within normal limits  BASIC METABOLIC PANEL - Abnormal; Notable for the following:    Glucose, Bld 158 (*)    All other components within normal limits   ____________________________________________  EKG  None ____________________________________________  RADIOLOGY   Dg Elbow Complete Left  Result Date: 08/10/2015 CLINICAL DATA:  Acute left elbow pain and swelling. Limited range of motion. EXAM: LEFT ELBOW - COMPLETE 3+ VIEW COMPARISON:  None. FINDINGS: An elbow effusion is noted. Bony densities along the lateral and medial elbow joint noted suggestive of degenerative  changes. No definite acute fracture, subluxation or dislocation identified. Soft tissue swelling is noted. IMPRESSION: Soft tissue swelling and elbow effusion with elbow degenerative changes. No definite acute bony abnormality. Electronically Signed   By: Harmon Pier M.D.   On: 08/10/2015 18:02  US Venous Img Upper Uni Left  Result Date: 08/10/2015 CLINICAL DATA:  Left upper extremity swelling EXAM: Left UPPER EXTREMITY VENOUS DOPPLER ULTRASOUND TECHNIQUE: Gray-scale sonography with graded compression, as well as color Doppler and duplex ultrasound were performed to evaluate the upper extremity deep venous system from the level of the subclavian vein and including the jugular, axillary, basilic, radial, ulnar and upper cephalic vein. Spectral Doppler was utilized to evaluate flow at rest and with distal augmentation maneuvers. COMPARISON:  None. FINDINGS: Contralateral Subclavian Vein: Respiratory phasicity is normal and symmetric with the symptomatic side. No evidence of thrombus. Normal compressibility. Internal Jugular Vein: No evidence of thrombus. Normal compressibility, respiratory phasicity and response to augmentation. Subclavian Vein: No evidence of thrombus. Normal compressibility, respiratory phasicity and response to augmentation. Axillary Vein: No evidence of thrombus. Normal compressibility, respiratory phasicity and response to augmentation. Cephalic Vein: No evidence of thrombus. Normal compressibility, respiratory phasicity and response to augmentation. Basilic Vein: No evidence of thrombus. Normal compressibility, respiratory phasicity and response to augmentation. Brachial Veins: No evidence of thrombus. Normal compressibility, respiratory phasicity and response to augmentation. Radial Veins: No evidence of thrombus. Normal compressibility, respiratory phasicity and response to augmentation. Ulnar Veins: No evidence of thrombus. Normal compressibility, respiratory phasicity and response to  augmentation. Venous Reflux:  None visualized. Other Findings:  None visualized. IMPRESSION: No evidence of deep venous thrombosis. Electronically Signed   By: Alcide Clever M.D.   On: 08/10/2015 19:05   ____________________________________________   PROCEDURES  Procedure(s) performed:   Procedures   ____________________________________________   INITIAL IMPRESSION / ASSESSMENT AND PLAN / ED COURSE  Pertinent labs & imaging results that were available during my care of the patient were reviewed by me and considered in my medical decision making (see chart for details).  Probable inflammation/swelling from soft tissue injury.  Less likely DVT, tendon rupture.  Very unlikely bony injury.  Will evaluate  with radiographs and LUE U/S to r/o DVT.  Clinical Course  Comment By Time  The patient's blood work, ultrasound, and radiographs are all unremarkable.  He was resting comfortably and I believe all of this supports my suspicion of soft tissue injury.  I gave him my usual customary recommendations and return precautions and him giving him follow-up information for orthopedics. Loleta Rose, MD 07/29 2002    ____________________________________________  FINAL CLINICAL IMPRESSION(S) / ED DIAGNOSES  Final diagnoses:  Muscle strain  Left arm pain     MEDICATIONS GIVEN DURING THIS VISIT:  Medications - No data to display   NEW OUTPATIENT MEDICATIONS STARTED DURING THIS VISIT:  New Prescriptions   No medications on file      Note:  This document was prepared using Dragon voice recognition software and may include unintentional dictation errors.    Loleta Rose, MD 08/10/15 2008

## 2016-02-13 DIAGNOSIS — H401133 Primary open-angle glaucoma, bilateral, severe stage: Secondary | ICD-10-CM | POA: Diagnosis not present

## 2016-02-19 DIAGNOSIS — H401133 Primary open-angle glaucoma, bilateral, severe stage: Secondary | ICD-10-CM | POA: Diagnosis not present

## 2016-03-25 DIAGNOSIS — H401133 Primary open-angle glaucoma, bilateral, severe stage: Secondary | ICD-10-CM | POA: Diagnosis not present

## 2016-04-15 DIAGNOSIS — H401133 Primary open-angle glaucoma, bilateral, severe stage: Secondary | ICD-10-CM | POA: Diagnosis not present

## 2016-05-27 DIAGNOSIS — H401133 Primary open-angle glaucoma, bilateral, severe stage: Secondary | ICD-10-CM | POA: Diagnosis not present

## 2016-09-24 DIAGNOSIS — H401111 Primary open-angle glaucoma, right eye, mild stage: Secondary | ICD-10-CM | POA: Diagnosis not present

## 2017-07-29 DIAGNOSIS — H401123 Primary open-angle glaucoma, left eye, severe stage: Secondary | ICD-10-CM | POA: Diagnosis not present

## 2017-12-24 ENCOUNTER — Telehealth: Payer: Self-pay | Admitting: *Deleted

## 2017-12-24 NOTE — Telephone Encounter (Signed)
Referral sent to scheduling and notes on file. °

## 2017-12-30 IMAGING — CR DG ELBOW COMPLETE 3+V*L*
5 series · 5 of 5 positions shown · non-contrast
Comparison: None.

CLINICAL DATA: Acute left elbow pain and swelling. Limited range of
motion.

EXAM:
LEFT ELBOW - COMPLETE 3+ VIEW

[elbow ap (1 of 2)]
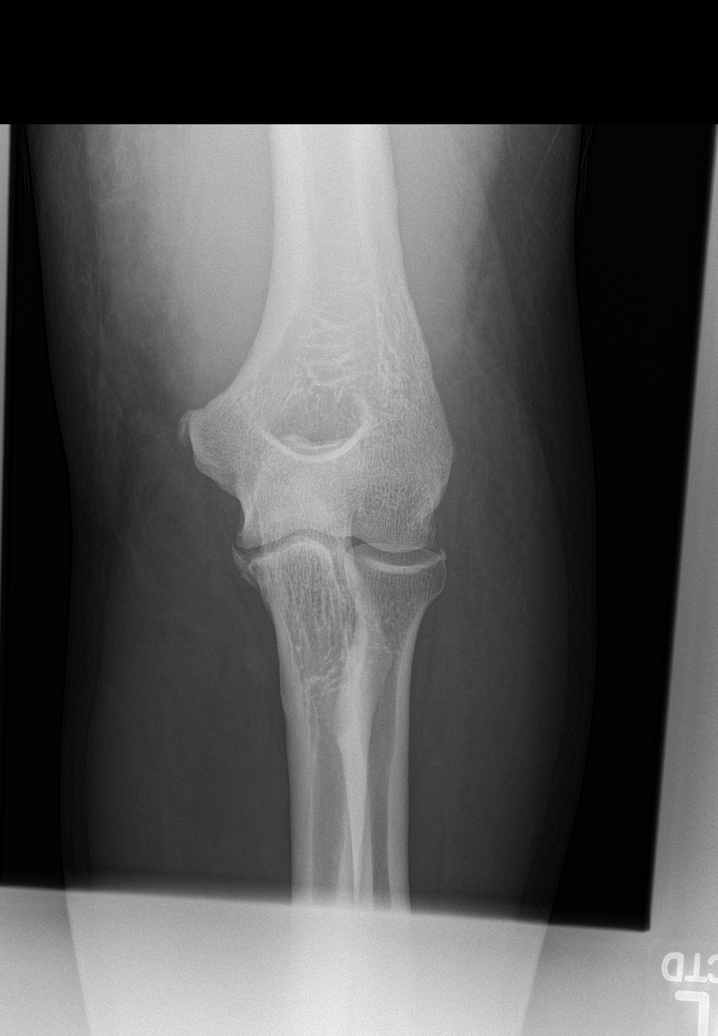

[elbow obl (1 of 2)]
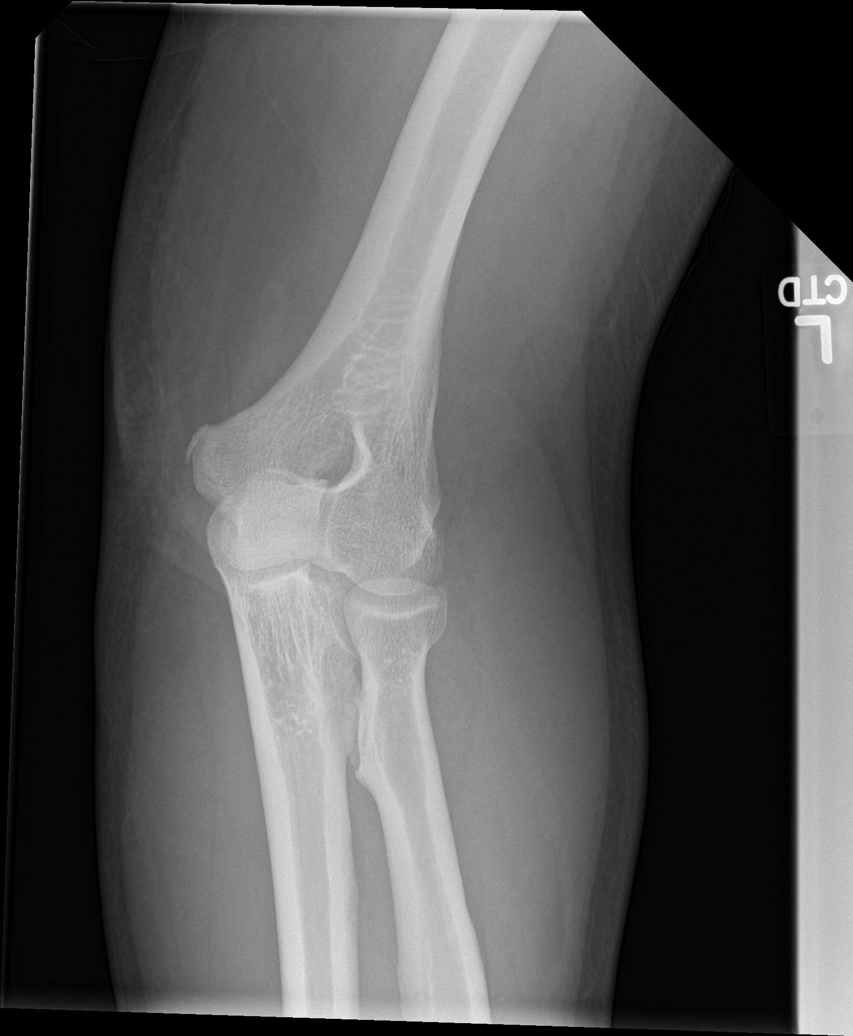

[elbow obl (2 of 2)]
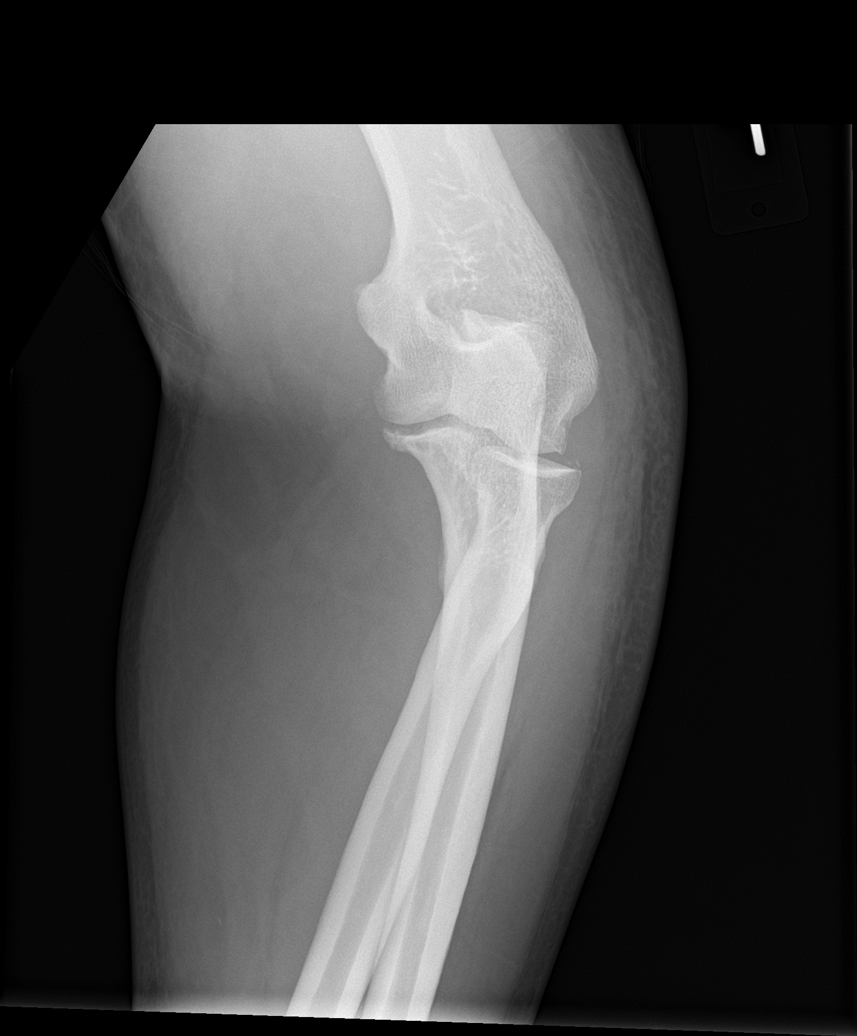

[elbow lat]
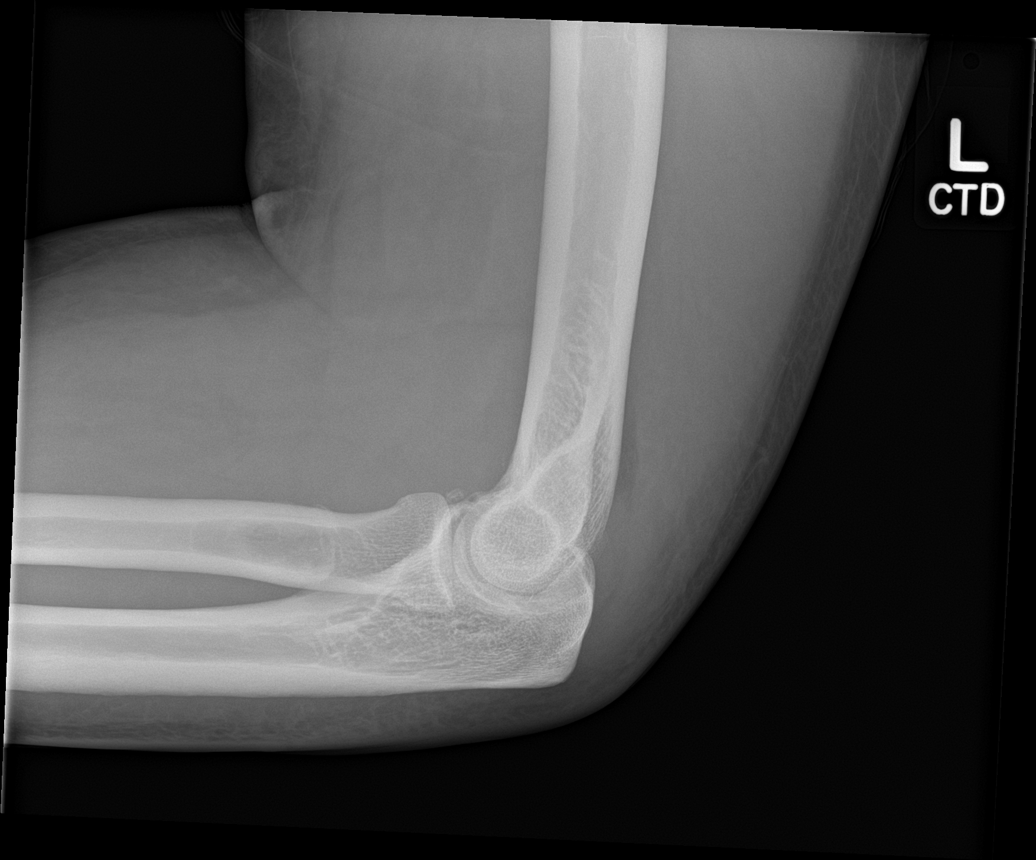

[elbow ap (2 of 2)]
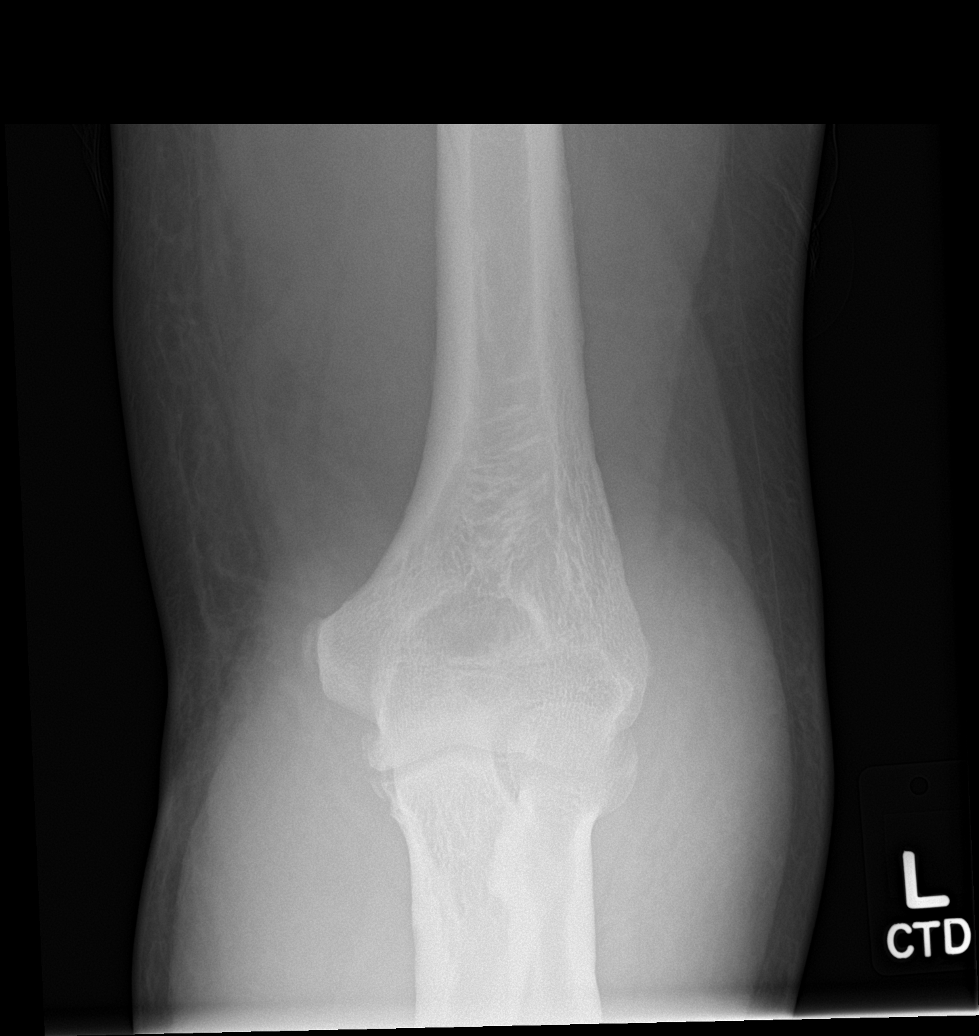

[5 of 5 positions shown; findings below may reference images not displayed]

FINDINGS: An elbow effusion is noted.

Bony densities along the lateral and medial elbow joint noted
suggestive of degenerative changes.

No definite acute fracture, subluxation or dislocation identified.

Soft tissue swelling is noted.
IMPRESSION: Soft tissue swelling and elbow effusion with elbow degenerative
changes. No definite acute bony abnormality.

## 2018-01-15 NOTE — Progress Notes (Signed)
Cardiology Office Note Date:  01/17/2018  Patient ID:  Garrett Frank, DOB 03/29/1945, MRN [id number] PCP:  Garrett Frank, Garrett Frank  Cardiologist:  Prior Dr. Eldridge DaceVaranasi, Frank patient who wants to transition to our [place] office    Chief Complaint: Follow up/establish at [facility]  History of Present Illness: Garrett Frank is a 73 y.o. male with history of reported CAD, DM2, hypertension, hyperlipidemia, morbid obesity, prior tobacco abuse quitting in 1995, nephrolithiasis, and gout who presents to establish care in our [place] and for evaluation of hypertension and hyperlipidemia with intolerance to Lipitor secondary to myalgias.  The patient was last seen by Dr. Eldridge DaceVaranasi in 2016 for routine follow-up.  He has been lost to follow-up since.  He has previously been evaluated by Dr. Juliann Paresallwood in 2012 with an exercise Myoview that showed fair exercise tolerance.  Myoview showed no clear evidence of stress induced myocardial ischemia with an EF of 65%.  There are no further ischemic evaluations on file for review.  He last underwent echocardiogram in 10/2010 with Dr. Juliann Paresallwood that showed mitral valve sclerosis, mild mitral regurgitation, mild tricuspid regurgitation, RVSP 28 mmHg.  This was a technically difficult study.  Further details of this study are unclear.  He was [facility] 12/[facility]/2017 for routine follow-up of hypertension, hyperlipidemia, diabetes, and gout.  He was noted to be hypertensive at that time with blood pressure 155/95.  Given the patient's history of hypertension, hyperlipidemia with prior statin intolerance, and diabetes he was referred back to cardiology for optimization of his cardiovascular risk factors.  Most recent labs:  12/2017 - glucose 125, BUN 17, serum creatinine 1.60, sodium 141, potassium 3.9, A1c 6.7, ferritin 103 2017 - WBC 11.4, Hgb 14.4, PLT 251 2015 - TC 173, TG 75, HDL 36, LDL 122, AST/ALT normal, albumin 3.1, A1c 7.4  Patient comes  in doing well from a cardiac perspective today.  He denies any chest pain.  He does report a history of exertional shortness of breath when he climbs a flight of stairs however indicates he could walk approximately 100 yards on flat ground.  He indicates he has previously been told he has an irregular heartbeat though this has not been formally evaluated to his knowledge.  He has attributed his lower extremity swelling to gout flareups and indicates the right lower extremity is typically more swollen than the left lower extremity.  He denies any orthopnea, PND, abdominal distention, or early satiety.  More recently, he does try to eat lower fat foods and tries to limit fast foods/restaurants.  He does not check his blood pressure at home nor does he weigh himself.  However, he reports he is no longer friends with "my lady friend" and in this setting is eating healthier and less food.  He denies any significant family history of CAD including his mother, father, and siblings.  He was a previous smoker from age 73 or [age] until age 73 at 1/3 to 1/2 pack daily.  He works a fairly active job in a [employer] and is generally asymptomatic when performing his job duties.  No recent falls.  No BRBPR or melena.  Past Medical History:  Diagnosis Date  . Diabetes mellitus   . Gout   . HTN (hypertension)   . Morbid obesity (HCC)   . Nephrolithiasis     Past Surgical History:  Procedure Laterality Date  . CHOLECYSTECTOMY      Current Meds  Medication Sig  .  amLODipine (NORVASC) 10 MG tablet Take 10 mg by mouth daily.  Marland Kitchen. aspirin 81 MG EC tablet Take 81 mg by mouth daily.    . colchicine 0.6 MG tablet Take 0.6 mg by mouth daily. Take 2 tablets by mouth on 1st onset of GOUT flare, follow in 1 hour with 1 tablet (maximum 3 tablets in a hour)  . docusate sodium (COLACE) 100 MG capsule Take 100 mg by mouth daily as needed for mild constipation.  . dorzolamide-timolol (COSOPT) 22.3-6.8 MG/ML ophthalmic  solution Place 1 drop into both eyes 2 (two) times daily.  . furosemide (LASIX) 40 MG tablet 1 tablet daily.  . hydrALAZINE (APRESOLINE) 50 MG tablet Take 1 tablet by mouth 3 (three) times daily.  . Insulin Detemir (LEVEMIR [place]) Inject 58 Units into the skin 2 (two) times daily.   Marland Kitchen. latanoprost (XALATAN) 0.005 % ophthalmic solution Place 1 drop into both eyes at bedtime.  Marland Kitchen. linagliptin (TRADJENTA) 5 MG TABS tablet Take 5 mg by mouth daily.  Marland Kitchen. losartan (COZAAR) 100 MG tablet Take 1 tablet by mouth daily.  . ONE TOUCH ULTRA TEST test strip CHECK BLOOD SUGAR TWICE A DAY AS DIRECTED DX CODE: 250.02  . Vitamin D, Ergocalciferol, (DRISDOL) 50000 UNITS CAPS capsule Take 1 capsule (50,000 Units total) by mouth every 7 (seven) days.  . [DISCONTINUED] atenolol (TENORMIN) 25 MG tablet Take 25 mg by mouth daily. for high blood pressure  . [DISCONTINUED] cyclobenzaprine (FLEXERIL) 5 MG tablet Take 5 mg by mouth 3 (three) times daily as needed for muscle spasms.  . [DISCONTINUED] glipiZIDE (GLUCOTROL) 10 MG tablet Take 10 mg by mouth daily.    . [DISCONTINUED] GLIPIZIDE XL 10 MG 24 hr tablet Take 1 tablet by mouth daily.  . [DISCONTINUED] meloxicam (MOBIC) 15 MG tablet TAKE 1 TABLET BY MOUTH DAILY FOR PAIN  . [DISCONTINUED] metoprolol succinate (TOPROL-XL) 50 MG 24 hr tablet Take 1 tablet by mouth daily.  . [DISCONTINUED] naproxen (NAPROSYN) 500 MG tablet Take 500 mg by mouth 2 (two) times daily as needed for mild pain (gout).    Allergies:   Statins; Sulfonamide derivatives; and Sulfur   Social History:  The patient  reports that he has quit smoking. His smoking use included cigarettes. He has a 7.50 pack-year smoking history. He has never used smokeless tobacco. He reports current alcohol use. He reports that he does not use drugs.   Family History:  The patient's family history includes Aneurysm (age of onset: 4673) in his mother; Hypertension in his father; Kidney failure in his father.  ROS:   Review of  Systems  Constitutional: Positive for malaise/fatigue. Negative for chills, diaphoresis, fever and weight loss.  HENT: Negative for congestion.   Eyes: Negative for discharge and redness.  Respiratory: Positive for shortness of breath. Negative for cough, hemoptysis, sputum production and wheezing.   Cardiovascular: Positive for leg swelling. Negative for chest pain, palpitations, orthopnea, claudication and PND.  Gastrointestinal: Negative for abdominal pain, blood in stool, heartburn, melena, nausea and vomiting.  Genitourinary: Negative for hematuria.  Musculoskeletal: Positive for joint pain. Negative for falls and myalgias.       Joint pain associated with gout flareups  Skin: Negative for rash.  Neurological: Negative for dizziness, tingling, tremors, sensory change, speech change, focal weakness, loss of consciousness and weakness.  Endo/Heme/Allergies: Does not bruise/bleed easily.  Psychiatric/Behavioral: Negative for substance abuse. The patient is not nervous/anxious.   All other systems reviewed and are negative.    PHYSICAL EXAM:  VS:  BP (!) 150/76 (BP Location: Left Arm, Patient Position: Sitting, Cuff Size: Large)   Pulse 73   Ht 6\' 1"  (1.854 Frank)   Wt 287 lb (130.2 kg)   BMI 37.87 kg/Frank  BMI: Body mass index is 37.87 kg/Frank.  Physical Exam  Constitutional: He is oriented to person, place, and time. He appears well-developed and well-nourished.  Obese  HENT:  Head: Normocephalic and atraumatic.  Eyes: Right eye exhibits no discharge. Left eye exhibits no discharge.  Neck: Normal range of motion. No JVD present.  Cardiovascular: Normal rate, regular rhythm, S1 normal, S2 normal and normal heart sounds. Exam reveals no distant heart sounds, no friction rub, no midsystolic click and no opening snap.  No murmur heard. Pulses:      Posterior tibial pulses are 2+ on the right side and 2+ on the left side.  Pulmonary/Chest: Effort normal and breath sounds normal. No  respiratory distress. He has no decreased breath sounds. He has no wheezes. He has no rales. He exhibits no tenderness.  Abdominal: Soft. He exhibits no distension. There is no abdominal tenderness.  Musculoskeletal:        General: Edema present.     Comments: Trace bilateral pretibial edema with the right lower extremity being greater than the left lower extremity  Neurological: He is alert and oriented to person, place, and time.  Skin: Skin is warm and dry. No cyanosis. Nails show no clubbing.  Psychiatric: He has a normal mood and affect. His speech is normal and behavior is normal. Judgment and thought content normal.     EKG:  Was ordered and interpreted by me today. Shows NSR, 73 bpm, normal axis, occasional PACs, rare PVC, nonspecific lateral ST-T changes  Recent Labs: No results found for requested labs within last 8760 hours.  No results found for requested labs within last 8760 hours.   CrCl cannot be calculated (Patient's most recent lab result is older than the maximum 21 days allowed.).   Wt Readings from Last 3 Encounters:  01/17/18 287 lb (130.2 kg)  08/10/15 297 lb (134.7 kg)  09/11/14 (!) 304 lb 9.6 oz (138.2 kg)     Other studies reviewed: Additional studies/records reviewed today include: summarized above  ASSESSMENT AND PLAN:  1. Dyspnea on exertion: This has been a longstanding issue for the patient and his most notable when he is walking up an incline such as stairs.  We will start our evaluation with an echocardiogram to further evaluate his LV systolic function, wall motion, pulmonary arterial pressure, and for potential valvular abnormalities.  He remains on Lasix 40 mg daily.  Check CMP as below.  2. Reported CAD/cardiovascular risk stratification/abnormal EKG: Asymptomatic.  Patient's cardiovascular risk factors include prior tobacco abuse, suboptimally controlled diabetes, hyperlipidemia with statin intolerance, hypertension, morbid obesity, and male age  greater than 55 years.  He will require further ischemic evaluation for risk stratification given his dyspnea and cardiovascular risk factors.  However, he is completely asymptomatic in our office today, thus we will await echocardiogram prior to proceeding with ischemic evaluation.  If patient is found to have a preserved LV systolic function would initially favor noninvasive imaging such as cardiac CT with FFR or nuclear stress test.  If patient is found to have a newly diagnosed cardiomyopathy will proceed with right and left cardiac catheterization.  Continue aspirin 81 mg daily.  Look to add statin or Zetia as outlined below pending updated labs.  3. Essential hypertension: Blood pressure is  suboptimally controlled today.  We reviewed each of his medications in detail one by one today and updated his medication list.  With regards to his hypertension, we have increased his Toprol-XL to 75 mg daily.  He otherwise remains on amlodipine 10 mg daily, Lasix 40 mg daily, hydralazine 50 mg twice daily, and losartan 100 mg daily.  4. Hyperlipidemia: Most recent LDL of 122 from 2015.  At a minimum, would recommend a goal LDL of less than 100, ideally less than 70.  Patient has documented intolerance to Lipitor secondary to myalgias.  We will obtain a CMP, lipid panel, and direct LDL today for further risk stratification.  Patient is agreeable to undergoing a trial of Crestor following updated labs.  If he demonstrates intolerance to Crestor would place the patient on Zetia.  Pending further risk stratification and ischemic evaluation he ultimately may require PCSK9 inhibitor if he is found to have significant coronary artery disease on further evaluation.  Recommend heart healthy diet.  5. PACs/PVCs: Asymptomatic.  Patient reports a long history of being told he has an irregular heartbeat though no formal evaluation has been undertaken.  Check CMP, magnesium, TSH, and CBC as well as echocardiogram as above.  If  echocardiogram demonstrates a cardiomyopathy he will require ischemic evaluation as above.  Consider outpatient cardiac monitoring.  Increase metoprolol as above.  6. CKD stage uncertain: Most recent serum creatinine noted to be 1.6 on 12/22/2017.  Prior to this, the only value for [place] to compare to was from 07/2015 and noted to be 1.11.  It is uncertain if this is in the setting of overdiuresis or if he is just had progression of his chronic kidney disease in the setting of diabetes and poor blood pressure control.  Would consider referring to nephrology though will defer to PCP.  Check CMP given the patient is on Lasix.  7. Morbid obesity: Weight loss is advised through healthy diet and exercise.  Patient would also likely benefit from outpatient sleep study.  Will defer this to PCP.  8. Prior tobacco abuse: [place]udos for quitting in 1995.  9. Diabetes: Managed by PCP with most recent A1c of 7.4.  Disposition: F/u with Dr. Okey Dupre in 1 month, to establish care.  Current medicines are reviewed at length with the patient today.  The patient did not have any concerns regarding medicines.  Signed, Eula Listen, PA-C 01/17/2018 4:51 PM     [facility] [place] [address] [place], [place] [address] [phone]

## 2018-01-17 ENCOUNTER — Encounter: Payer: Self-pay | Admitting: Physician Assistant

## 2018-01-17 ENCOUNTER — Ambulatory Visit (INDEPENDENT_AMBULATORY_CARE_PROVIDER_SITE_OTHER): Payer: 59 | Admitting: Physician Assistant

## 2018-01-17 ENCOUNTER — Other Ambulatory Visit
Admission: RE | Admit: 2018-01-17 | Discharge: 2018-01-17 | Disposition: A | Discharge status: Home / Self Care | Payer: 59 | Source: Ambulatory Visit | Attending: Physician Assistant | Admitting: Physician Assistant

## 2018-01-17 VITALS — BP 150/76 | HR 73 | Ht 73.0 in | Wt 287.0 lb

## 2018-01-17 DIAGNOSIS — I493 Ventricular premature depolarization: Secondary | ICD-10-CM | POA: Diagnosis not present

## 2018-01-17 DIAGNOSIS — Z87891 Personal history of nicotine dependence: Secondary | ICD-10-CM | POA: Diagnosis not present

## 2018-01-17 DIAGNOSIS — E785 Hyperlipidemia, unspecified: Secondary | ICD-10-CM

## 2018-01-17 DIAGNOSIS — I2510 Atherosclerotic heart disease of native coronary artery without angina pectoris: Secondary | ICD-10-CM

## 2018-01-17 DIAGNOSIS — N189 Chronic kidney disease, unspecified: Secondary | ICD-10-CM

## 2018-01-17 DIAGNOSIS — R9431 Abnormal electrocardiogram [ECG] [EKG]: Secondary | ICD-10-CM

## 2018-01-17 DIAGNOSIS — I10 Essential (primary) hypertension: Secondary | ICD-10-CM

## 2018-01-17 DIAGNOSIS — I491 Atrial premature depolarization: Secondary | ICD-10-CM | POA: Diagnosis not present

## 2018-01-17 DIAGNOSIS — R0609 Other forms of dyspnea: Secondary | ICD-10-CM | POA: Diagnosis not present

## 2018-01-17 LAB — COMPREHENSIVE METABOLIC PANEL
ALK PHOS: 70 U/L (ref 38–126)
ALT: 12 U/L (ref 0–44)
AST: 16 U/L (ref 15–41)
Albumin: 4 g/dL (ref 3.5–5.0)
Anion gap: 7 (ref 5–15)
BILIRUBIN TOTAL: 0.4 mg/dL (ref 0.3–1.2)
BUN: 20 mg/dL (ref 8–23)
CHLORIDE: 105 mmol/L (ref 98–111)
CO2: 26 mmol/L (ref 22–32)
CREATININE: 1.45 mg/dL — AB (ref 0.61–1.24)
Calcium: 9 mg/dL (ref 8.9–10.3)
GFR calc Af Amer: 55 mL/min — ABNORMAL LOW (ref >60)
GFR calc non Af Amer: 48 mL/min — ABNORMAL LOW (ref >60)
Glucose, Bld: 160 mg/dL — ABNORMAL HIGH (ref 70–99)
Potassium: 3.9 mmol/L (ref 3.5–5.1)
Sodium: 138 mmol/L (ref 135–145)
TOTAL PROTEIN: 7.4 g/dL (ref 6.5–8.1)

## 2018-01-17 LAB — LIPID PANEL
CHOLESTEROL: 181 mg/dL (ref 0–200)
HDL: 38 mg/dL — ABNORMAL LOW (ref >40)
LDL Cholesterol: 112 mg/dL — ABNORMAL HIGH (ref 0–99)
TRIGLYCERIDES: 156 mg/dL — AB (ref <150)
Total CHOL/HDL Ratio: 4.8 RATIO
VLDL: 31 mg/dL (ref 0–40)

## 2018-01-17 LAB — TSH: TSH: 4.211 u[IU]/mL (ref 0.350–4.500)

## 2018-01-17 MED ORDER — METOPROLOL SUCCINATE ER 25 MG PO TB24: 75.0000 mg | ORAL_TABLET | Freq: Every day | ORAL | 3 refills | Status: DC

## 2018-01-17 NOTE — Patient Instructions (Signed)
Medication Instructions: Your physician has recommended you make the following change in your medication:  1- INCREASE Toprol to 3 tablets (75 mg total) once daily If you need a refill on your cardiac medications before your next appointment, please call your pharmacy.   Lab work: Your physician recommends that you return for lab work today at [facility] (CMP, lipid, direct LDL, TSH) If you have labs (blood work) drawn today and your tests are completely normal, you will receive your results only by: Marland Kitchen MyChart Message (if you have MyChart) OR . A paper copy in the mail If you have any lab test that is abnormal or we need to change your treatment, we will call you to review the results.  Testing/Procedures: 1- Echo Your physician has requested that you have an echocardiogram. Echocardiography is a painless test that uses sound waves to create images of your heart. It provides your doctor with information about the size and shape of your heart and how well your heart's chambers and valves are working. This procedure takes approximately one hour. There are no restrictions for this procedure.    Follow-Up: At [facility], you and your health needs are our priority.  As part of our continuing mission to provide you with exceptional heart care, we have created designated Provider Care Teams.  These Care Teams include your primary Cardiologist (physician) and Advanced Practice Providers (APPs -  Physician Assistants and Nurse Practitioners) who all work together to provide you with the care you need, when you need it. You will need a follow up appointment in 1 months.  Please call our office 2 months in advance to schedule this appointment.  You may see Dr. Okey Dupre or one of the following Advanced Practice Providers on your designated Care Team:   Nicolasa Ducking, NP Eula Listen, PA-C . Marisue Ivan, PA-C

## 2018-01-18 LAB — LDL CHOLESTEROL, DIRECT: LDL DIRECT: 112 mg/dL — AB (ref 0–99)

## 2018-01-19 ENCOUNTER — Telehealth: Payer: Self-pay

## 2018-01-19 DIAGNOSIS — E785 Hyperlipidemia, unspecified: Secondary | ICD-10-CM

## 2018-01-19 MED ORDER — ROSUVASTATIN CALCIUM 5 MG PO TABS: 5.0000 mg | ORAL_TABLET | Freq: Every day | ORAL | 3 refills | Status: DC

## 2018-01-19 NOTE — Telephone Encounter (Signed)
Call to patient to discuss lab work and POC from provider. Pt verbalized understanding and agreeable to POC. Will return to get lab work in 8 weeks, he request a reminder letter which I will send my mail.  Crestor will be sent electronically to pt preferred pharm.   Advised pt to call for any further questions or concerns

## 2018-01-19 NOTE — Telephone Encounter (Signed)
-----   Message from Sondra Barges, PA-C sent at 01/18/2018  7:50 AM EST ----- Thyroid function normal. Renal function is above baseline compared to values in the remote past though improved from most recent value that was given to [place] by [facility] of 1.6 from 12/22/2017. Potassium okay. Glucose remains poorly controlled.  Recommend he follow-up with PCP for this. Liver function normal. LDL improved though remains above goal. Please start Crestor 5 mg daily.  If patient does not tolerate this please have him let us know.  Plan to recheck fasting lipid and liver function in 8 weeks.

## 2018-01-25 DIAGNOSIS — H401123 Primary open-angle glaucoma, left eye, severe stage: Secondary | ICD-10-CM | POA: Diagnosis not present

## 2018-02-01 DIAGNOSIS — H401112 Primary open-angle glaucoma, right eye, moderate stage: Secondary | ICD-10-CM | POA: Diagnosis not present

## 2018-02-04 ENCOUNTER — Ambulatory Visit (INDEPENDENT_AMBULATORY_CARE_PROVIDER_SITE_OTHER): Payer: 59

## 2018-02-04 ENCOUNTER — Other Ambulatory Visit: Payer: Self-pay

## 2018-02-04 DIAGNOSIS — R0609 Other forms of dyspnea: Secondary | ICD-10-CM

## 2018-02-04 DIAGNOSIS — I209 Angina pectoris, unspecified: Secondary | ICD-10-CM

## 2018-02-04 MED ORDER — PERFLUTREN LIPID MICROSPHERE
1.0000 mL | INTRAVENOUS | Status: AC | PRN
  Administered 2018-02-04: 2 mL via INTRAVENOUS

## 2018-02-07 ENCOUNTER — Telehealth: Payer: Self-pay

## 2018-02-07 NOTE — Telephone Encounter (Signed)
Attempted to call patient. LMTCB 1/27 

## 2018-02-07 NOTE — Telephone Encounter (Signed)
-----   Message from Creig Hines, NP sent at 02/05/2018  6:52 AM EST ----- Nl heart squeezing fxn w/o significant valvular dzs.  Heart is mildly stiff, thus it is important that we focus on hr/bp mgmt.  bp was elevated @ most recent office visit and Ryan increased toprol @ that time.

## 2018-02-09 NOTE — Telephone Encounter (Signed)
Spoke with patient on phone regarding Echo results.Pt verbalized understanding and had no further questions at this time.   Pt agreed to f/u as previously planned. Encouraged pt to call with any further questions or concerns.

## 2018-02-17 ENCOUNTER — Ambulatory Visit: Payer: 59 | Admitting: Nurse Practitioner

## 2018-03-10 NOTE — Progress Notes (Signed)
Cardiology Office Note Date:  03/11/2018  Patient ID:  Garrett Frank, Garrett Frank [birth year], MRN [id number] PCP:  Leanna Sato, MD  Cardiologist:  Prior Dr. Eldridge Dace, MD patient who wants to transition to our [place] office    Chief Complaint: Follow up  History of Present Illness: Garrett Frank is a 73 y.o. male with history of reported CAD, DM2, hypertension, hyperlipidemia, morbid obesity, prior tobacco abuse quitting in 1995, nephrolithiasis, and gout who presents for follow-up of his CAD, hypertension, and hyperlipidemia.  The patient was followed by Dr. Eldridge Dace, with him last being seen in 2016 and more recently established with the [facility] clinic.  He has previously been evaluated by Dr. Juliann Pares in 2012 with an exercise Myoview that showed fair exercise tolerance.  Myoview showed no clear evidence of stress induced myocardial ischemia with an EF of 65%.  There are no further ischemic evaluations on file for review.  He underwent echo in 10/2010 with Dr. Juliann Pares that showed mitral valve sclerosis, mild mitral regurgitation, mild tricuspid regurgitation, RVSP 28 mmHg.  This was a technically difficult study.  Further details of this study are unclear.  He was seen by [facility] clinic on 12/22/2017 for routine follow-up of hypertension, hyperlipidemia, diabetes, and gout.  He was noted to be hypertensive at that time with blood pressure 155/95.  Given the patient's history of hypertension, hyperlipidemia with prior statin intolerance, and diabetes he was referred back to cardiology for optimization of his cardiovascular risk factors.  Patient was seen in the [place] office on 01/17/2018 to establish care and was doing well from a cardiac perspective.  He did note exertional shortness of breath when he climbs a flight of stairs however he reported he could ambulate approximately 100 yards on flat ground.  He reported having previously been told he had an irregular heartbeat though has never  been formally evaluated for this to his knowledge.  He attributed his lower extremity swelling to gout flareups and indicated his right lower extremity was typically more swollen than the left lower extremity.  His blood pressure is suboptimally controlled at 150/76.  In this setting, his Toprol was increased to 75 mg daily.  He was otherwise continued on amlodipine, Lasix, hydralazine, and losartan.  He was noted to have statin intolerance though was agreeable to start Crestor 5 mg daily.  Given his chronic dyspnea, he underwent echo on 02/04/2018 that showed an EF of 55 to 65%, normal wall motion, grade 1 diastolic dysfunction, normal size left atrium, normal RV systolic function, PASP normal.  Most recent labs:  01/2018 - potassium 3.9, glucose 160, serum creatinine 1.45 with prior baseline 1.1, TSH normal, TG 156, LDL 112  Patient comes in doing well from a cardiac perspective.  He denies any chest pain, shortness of breath, dizziness, presyncope, or syncope.  He continues to live a fairly active lifestyle and has a strenuous job which he is able to complete without limitations.  No lower extremity swelling, abdominal distention, orthopnea, PND, early satiety.  No falls since he was last seen.  No BRBPR or melena.  Weight remains stable.  He did try Crestor though was unable to tolerate this secondary to myalgias.  He continues to try and eat healthier foods though does eat some foods that are higher in sodium.  He does not have any cardiac issues or concerns today.   Past Medical History:  Diagnosis Date  . Diabetes mellitus   . Gout   . HTN (hypertension)   .  Morbid obesity (HCC)   . Nephrolithiasis     Past Surgical History:  Procedure Laterality Date  . CHOLECYSTECTOMY      Current Meds  Medication Sig  . amLODipine (NORVASC) 10 MG tablet Take 10 mg by mouth daily.  Marland Kitchen aspirin 81 MG EC tablet Take 81 mg by mouth daily.    . colchicine 0.6 MG tablet Take 0.6 mg by mouth daily. Take 2  tablets by mouth on 1st onset of GOUT flare, follow in 1 hour with 1 tablet (maximum 3 tablets in a hour)  . docusate sodium (COLACE) 100 MG capsule Take 100 mg by mouth daily as needed for mild constipation.  . dorzolamide-timolol (COSOPT) 22.3-6.8 MG/ML ophthalmic solution Place 1 drop into both eyes 2 (two) times daily.  . furosemide (LASIX) 40 MG tablet 1 tablet daily.  Marland Kitchen latanoprost (XALATAN) 0.005 % ophthalmic solution Place 1 drop into both eyes at bedtime.  Marland Kitchen linagliptin (TRADJENTA) 5 MG TABS tablet Take 5 mg by mouth daily.  Marland Kitchen losartan (COZAAR) 100 MG tablet Take 1 tablet by mouth daily.  . metoprolol succinate (TOPROL-XL) 25 MG 24 hr tablet Take 3 tablets (75 mg total) by mouth daily. Take with or immediately following a meal.  . ONE TOUCH ULTRA TEST test strip CHECK BLOOD SUGAR TWICE A DAY AS DIRECTED DX CODE: 250.02  . rosuvastatin (CRESTOR) 5 MG tablet Take 1 tablet (5 mg total) by mouth at bedtime.  . [DISCONTINUED] hydrALAZINE (APRESOLINE) 50 MG tablet Take 1 tablet by mouth 3 (three) times daily.    Allergies:   Statins; Sulfonamide derivatives; and Sulfur   Social History:  The patient  reports that he has quit smoking. His smoking use included cigarettes. He has a 7.50 pack-year smoking history. He has never used smokeless tobacco. He reports current alcohol use. He reports that he does not use drugs.   Family History:  The patient's family history includes Aneurysm (age of onset: 8) in his mother; Hypertension in his father; Kidney failure in his father.  ROS:   Review of Systems  Constitutional: Negative for chills, diaphoresis, fever, malaise/fatigue and weight loss.  HENT: Negative for congestion.   Eyes: Negative for discharge and redness.  Respiratory: Negative for cough, hemoptysis, sputum production, shortness of breath and wheezing.   Cardiovascular: Negative for chest pain, palpitations, orthopnea, claudication, leg swelling and PND.  Gastrointestinal: Negative  for abdominal pain, blood in stool, heartburn, melena, nausea and vomiting.  Genitourinary: Negative for hematuria.  Musculoskeletal: Negative for falls and myalgias.  Skin: Negative for rash.  Neurological: Negative for dizziness, tingling, tremors, sensory change, speech change, focal weakness, loss of consciousness and weakness.  Endo/Heme/Allergies: Does not bruise/bleed easily.  Psychiatric/Behavioral: Negative for substance abuse. The patient is not nervous/anxious.   All other systems reviewed and are negative.    PHYSICAL EXAM:  VS:  BP (!) 156/80 (BP Location: Left Arm, Patient Position: Sitting, Cuff Size: Normal)   Pulse 67   Ht 6\' 1"  (1.854 m)   Wt 285 lb (129.3 kg)   BMI 37.60 kg/m  BMI: Body mass index is 37.6 kg/m.  Physical Exam  Constitutional: He is oriented to person, place, and time. He appears well-developed and well-nourished.  HENT:  Head: Normocephalic and atraumatic.  Eyes: Right eye exhibits no discharge. Left eye exhibits no discharge.  Neck: Normal range of motion. No JVD present.  Cardiovascular: Normal rate, regular rhythm, S1 normal, S2 normal and normal heart sounds. Exam reveals no distant heart sounds,  no friction rub, no midsystolic click and no opening snap.  No murmur heard. Pulses:      Posterior tibial pulses are 2+ on the right side and 2+ on the left side.  Pulmonary/Chest: Effort normal and breath sounds normal. No respiratory distress. He has no decreased breath sounds. He has no wheezes. He has no rales. He exhibits no tenderness.  Abdominal: Soft. He exhibits no distension. There is no abdominal tenderness.  Musculoskeletal:        General: No edema.  Neurological: He is alert and oriented to person, place, and time.  Skin: Skin is warm and dry. No cyanosis. Nails show no clubbing.  Psychiatric: He has a normal mood and affect. His speech is normal and behavior is normal. Judgment and thought content normal.     EKG:  Was ordered and  interpreted by me today. Shows NSR, 67 bpm, rare PAC, normal axis, nonspecific ST-T changes  Recent Labs: 01/17/2018: ALT 12; BUN 20; Creatinine, Ser 1.45; Potassium 3.9; Sodium 138; TSH 4.211  01/17/2018: Cholesterol 181; Direct LDL 112; HDL 38; LDL Cholesterol 112; Total CHOL/HDL Ratio 4.8; Triglycerides 156; VLDL 31   CrCl cannot be calculated (Patient's most recent lab result is older than the maximum 21 days allowed.).   Wt Readings from Last 3 Encounters:  03/11/18 285 lb (129.3 kg)  01/17/18 287 lb (130.2 kg)  08/10/15 297 lb (134.7 kg)     Other studies reviewed: Additional studies/records reviewed today include: summarized above  ASSESSMENT AND PLAN:  1. Reported CAD: He is doing well without any symptoms concerning for angina.  He does have multiple cardiovascular risk factors including prior tobacco abuse, suboptimally controlled diabetes, hyperlipidemia with statin intolerance, hypertension, morbid obesity, and male age greater than 55 years.  Recent echo given shortness of breath showed preserved LV systolic function with no significant valvular abnormalities and normal pulmonary pressures.  Given this and in the setting of the patient being asymptomatic, we will defer further ischemic evaluation at this time.  However, should he develop any symptoms concerning for ischemia this will be reconsidered and appropriate testing will be performed based on symptoms.  Recommend aggressive risk factor modification.  Continue aspirin 81 mg daily.  2. Hypertension: Blood pressure is suboptimally controlled.  We reviewed each medication in detail one by one.  Increase hydralazine to 100 mg 3 times daily.  He will otherwise continue amlodipine, Lasix, losartan, and Toprol-XL.  Blood pressure precludes further escalation of beta-blocker.  Recommend he eat a lower sodium diet and lose weight.  He may benefit from outpatient sleep study however there was no evidence of pulmonary hypertension on recent  echo.  3. Hyperlipidemia.  He is intolerant to both Lipitor and Crestor secondary to myalgias and arthralgias.  He refuses to try any further statins.  He has agreed for a trial of Zetia 10 mg daily.  Heart healthy diet recommended.  Check CMP, lipid panel, and direct LDL.  4. CKD stage III: Serum creatinine of 1.45 from 01/17/2018 which was improved when compared to value of 1.6 from 12/22/2017.  Check CMP as above given the patient is on Lasix.  Would consider referring the patient to nephrology though this will be deferred to the patient's PCP.  5. Morbid obesity: Weight loss is advised through heart healthy diet and exercise.  Again, the patient would likely benefit from outpatient sleep study.  Defer this to PCP.  6. Diabetes: Managed by PCP.  Disposition: F/u with any [place] cardiologist within  our group to reestablish care.     Current medicines are reviewed at length with the patient today.  The patient did not have any concerns regarding medicines.  Signed, Eula Listen, PA-C 03/11/2018 3:28 PM     [facility] [address] [place], [place] [address] [phone]

## 2018-03-11 ENCOUNTER — Ambulatory Visit (INDEPENDENT_AMBULATORY_CARE_PROVIDER_SITE_OTHER): Payer: 59 | Admitting: Physician Assistant

## 2018-03-11 ENCOUNTER — Encounter: Payer: Self-pay | Admitting: Physician Assistant

## 2018-03-11 VITALS — BP 156/80 | HR 67 | Ht 73.0 in | Wt 285.0 lb

## 2018-03-11 DIAGNOSIS — E782 Mixed hyperlipidemia: Secondary | ICD-10-CM | POA: Diagnosis not present

## 2018-03-11 DIAGNOSIS — I10 Essential (primary) hypertension: Secondary | ICD-10-CM

## 2018-03-11 DIAGNOSIS — I2510 Atherosclerotic heart disease of native coronary artery without angina pectoris: Secondary | ICD-10-CM | POA: Diagnosis not present

## 2018-03-11 DIAGNOSIS — N183 Chronic kidney disease, stage 3 unspecified: Secondary | ICD-10-CM

## 2018-03-11 DIAGNOSIS — I209 Angina pectoris, unspecified: Secondary | ICD-10-CM | POA: Diagnosis not present

## 2018-03-11 MED ORDER — HYDRALAZINE HCL 100 MG PO TABS: 100.0000 mg | ORAL_TABLET | Freq: Three times a day (TID) | ORAL | 11 refills | Status: DC

## 2018-03-11 MED ORDER — EZETIMIBE 10 MG PO TABS: 10.0000 mg | ORAL_TABLET | Freq: Every day | ORAL | 3 refills | Status: DC

## 2018-03-11 NOTE — Patient Instructions (Signed)
Medication Instructions:  Your physician has recommended you make the following change in your medication:  1- STOP Crestor 2- START Zetia Take 1 tablet (10 mg total) by mouth daily 3- INCREASE Hydralazine Take 1 tablet (100 mg total) by mouth 3 (three) times daily  If you need a refill on your cardiac medications before your next appointment, please call your pharmacy.   Lab work: Your physician recommends that you have lab work today(CMET, Lipid, direct LDL).  If you have labs (blood work) drawn today and your tests are completely normal, you will receive your results only by: Marland Kitchen MyChart Message (if you have MyChart) OR . A paper copy in the mail If you have any lab test that is abnormal or we need to change your treatment, we will call you to review the results.  Testing/Procedures: None ordered   Follow-Up: At [facility], you and your health needs are our priority.  As part of our continuing mission to provide you with exceptional heart care, we have created designated Provider Care Teams.  These Care Teams include your primary Cardiologist (physician) and Advanced Practice Providers (APPs -  Physician Assistants and Nurse Practitioners) who all work together to provide you with the care you need, when you need it. You will need a follow up appointment in 3 months.  Please see MD at  next visit to establish a primary cardiologist.

## 2018-03-12 LAB — LIPID PANEL
CHOLESTEROL TOTAL: 158 mg/dL (ref 100–199)
Chol/HDL Ratio: 4.6 ratio (ref 0.0–5.0)
HDL: 34 mg/dL — ABNORMAL LOW (ref >39)
LDL Calculated: 98 mg/dL (ref 0–99)
Triglycerides: 128 mg/dL (ref 0–149)
VLDL Cholesterol Cal: 26 mg/dL (ref 5–40)

## 2018-03-12 LAB — COMPREHENSIVE METABOLIC PANEL
ALBUMIN: 4 g/dL (ref 3.7–4.7)
ALK PHOS: 76 IU/L (ref 39–117)
ALT: 11 IU/L (ref 0–44)
AST: 14 IU/L (ref 0–40)
Albumin/Globulin Ratio: 1.4 (ref 1.2–2.2)
BUN / CREAT RATIO: 14 (ref 10–24)
BUN: 20 mg/dL (ref 8–27)
Bilirubin Total: 0.2 mg/dL (ref 0.0–1.2)
CO2: 22 mmol/L (ref 20–29)
Calcium: 8.9 mg/dL (ref 8.6–10.2)
Chloride: 104 mmol/L (ref 96–106)
Creatinine, Ser: 1.48 mg/dL — ABNORMAL HIGH (ref 0.76–1.27)
GFR calc Af Amer: 54 mL/min/{1.73_m2} — ABNORMAL LOW (ref >59)
GFR calc non Af Amer: 47 mL/min/{1.73_m2} — ABNORMAL LOW (ref >59)
GLUCOSE: 100 mg/dL — AB (ref 65–99)
Globulin, Total: 2.9 g/dL (ref 1.5–4.5)
Potassium: 4 mmol/L (ref 3.5–5.2)
Sodium: 138 mmol/L (ref 134–144)
TOTAL PROTEIN: 6.9 g/dL (ref 6.0–8.5)

## 2018-03-12 LAB — LDL CHOLESTEROL, DIRECT: LDL Direct: 103 mg/dL — ABNORMAL HIGH (ref 0–99)

## 2018-06-13 NOTE — Progress Notes (Signed)
Virtual Visit via Telephone Note   This visit type was conducted due to national recommendations for restrictions regarding the COVID-19 Pandemic (e.g. social distancing) in an effort to limit this patient's exposure and mitigate transmission in our community.  Due to his co-morbid illnesses, this patient is at least at moderate risk for complications without adequate follow up.  This format is felt to be most appropriate for this patient at this time.  The patient did not have access to video technology/had technical difficulties with video requiring transitioning to audio format only (telephone).  All issues noted in this document were discussed and addressed.  No physical exam could be performed with this format.  Please refer to the patient's chart for his  consent to telehealth for [facility].   I connected with  Garrett Frank on 06/14/18 by a video enabled telemedicine application and verified that I am speaking with the correct person using two identifiers. I discussed the limitations of evaluation and management by telemedicine. The patient expressed understanding and agreed to proceed.   Evaluation Performed:  Follow-up visit  Date:  06/14/2018   ID:  Garrett Frank, Garrett Frank 03/17/1945, MRN [id number]  Patient Location:  [address] [place] [address]   Provider location:   [facility], [place] office  PCP:  Leanna Sato, MD  Cardiologist:  [facility]   Chief Complaint: Little bit of leg swelling    History of Present Illness:    Garrett Frank is a 73 y.o. male who presents via audio/video conferencing for a telehealth visit today.   The patient does not symptoms concerning for COVID-19 infection (fever, chills, cough, or new SHORTNESS OF BREATH).   Patient has a past medical history of DM2,  hypertension,  hyperlipidemia,  morbid obesity, prior tobacco abuse quitting in 1995 nephrolithiasis,  and gout who presents for  follow-up of his CAD, hypertension, and hyperlipidemia.  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  Thinks it has been running okay  No chest pain on exertion concerning for angina  Followed by the [facility] clinic  Chronic right lower extremity typically more swollen than the left lower extremity.    Crestor 5 mg daily with Zetia  echo on 02/04/2018 that showed an EF of 55 to 65%, normal wall motion, grade 1 diastolic dysfunction, normal size left atrium, normal RV systolic function, PASP normal.  Most recent labs: 01/2018 - potassium 3.9, glucose 160, serum creatinine 1.45 with prior baseline 1.1, TSH normal, TG 156, LDL 112  Stress test 2012   Prior CV studies:   The following studies were reviewed today:   Past Medical History:  Diagnosis Date  . Diabetes mellitus   . Gout   . HTN (hypertension)   . Morbid obesity (HCC)   . Nephrolithiasis    Past Surgical History:  Procedure Laterality Date  . CHOLECYSTECTOMY       No outpatient medications have been marked as  taking for the 06/14/18 encounter (Appointment) with Antonieta IbaGollan,  J, MD.     Allergies:   Statins; Sulfonamide derivatives; and Sulfur   Social History   Tobacco Use  . Smoking status: Former Smoker    Packs/day: 0.50    Years: 15.00    Pack years: 7.50    Types: Cigarettes  . Smokeless tobacco: Never Used  Substance Use Topics  . Alcohol use: Yes    Comment: ocassionally  . Drug use: No     Current Outpatient Medications on File Prior  to Visit  Medication Sig Dispense Refill  . amLODipine (NORVASC) 10 MG tablet Take 10 mg by mouth daily.    Marland Kitchen. aspirin 81 MG EC tablet Take 81 mg by mouth daily.      . colchicine 0.6 MG tablet Take 0.6 mg by mouth daily. Take 2 tablets by mouth on 1st onset of GOUT flare, follow in 1 hour with 1 tablet (maximum 3 tablets in a hour)    . docusate sodium (COLACE) 100 MG capsule Take 100 mg by mouth daily as needed for mild constipation.    . dorzolamide-timolol (COSOPT) 22.3-6.8 MG/ML ophthalmic solution Place 1 drop into both eyes 2 (two) times daily.    Marland Kitchen. ezetimibe (ZETIA) 10 MG tablet Take 1 tablet (10 mg total) by mouth daily. 90 tablet 3  . furosemide (LASIX) 40 MG tablet 1 tablet daily. 30 tablet 6  . hydrALAZINE (APRESOLINE) 100 MG tablet Take 1 tablet (100 mg total) by mouth 3 (three) times daily. 90 tablet 11  . latanoprost (XALATAN) 0.005 % ophthalmic solution Place 1 drop into both eyes at bedtime.  5  . linagliptin (TRADJENTA) 5 MG TABS tablet Take 5 mg by mouth daily.    Marland Kitchen. losartan (COZAAR) 100 MG tablet Take 1 tablet by mouth daily.    . metoprolol succinate (TOPROL-XL) 25 MG 24 hr tablet Take 3 tablets (75 mg total) by mouth daily. Take with or immediately following a meal. 180 tablet 3  . ONE TOUCH ULTRA TEST test strip CHECK BLOOD SUGAR TWICE A DAY AS DIRECTED DX CODE: 250.02  11  . rosuvastatin (CRESTOR) 5 MG tablet Take 1 tablet (5 mg total) by mouth at bedtime. 90 tablet 3   No current facility-administered medications on file prior to visit.      Family Hx: The patient's family history includes Aneurysm (age of onset: 8173) in his mother; Hypertension in his father; Kidney failure in his father. There is no history of Coronary artery disease.  ROS:   Please see the history of present illness.    Review of Systems  Constitutional: Negative.   HENT: Negative.   Respiratory: Negative.   Cardiovascular: Negative.   Gastrointestinal: Negative.   Musculoskeletal: Negative.    Neurological: Negative.   Psychiatric/Behavioral: Negative.   All other systems reviewed and are negative.    Labs/Other Tests and Data Reviewed:    Recent Labs: 01/17/2018: TSH 4.211 03/11/2018: ALT 11; BUN 20; Creatinine, Ser 1.48; Potassium 4.0; Sodium 138   Recent Lipid Panel Lab Results  Component Value Date/Time   CHOL 158 03/11/2018 02:58 PM   TRIG 128 03/11/2018 02:58 PM   HDL 34 (L) 03/11/2018 02:58 PM   CHOLHDL 4.6 03/11/2018 02:58 PM   CHOLHDL 4.8 01/17/2018 04:57 PM   LDLCALC 98 03/11/2018 02:58 PM   LDLDIRECT 103 (H) 03/11/2018 02:58 PM   LDLDIRECT 112 (H) 01/17/2018 04:57 PM    Wt Readings from Last 3 Encounters:  03/11/18  285 lb (129.3 kg)  01/17/18 287 lb (130.2 kg)  08/10/15 297 lb (134.7 kg)     Exam:    Vital Signs: Vital signs may also be detailed in the HPI There were no vitals taken for this visit.  Wt Readings from Last 3 Encounters:  03/11/18 285 lb (129.3 kg)  01/17/18 287 lb (130.2 kg)  08/10/15 297 lb (134.7 kg)   Temp Readings from Last 3 Encounters:  08/10/15 99.3 F (37.4 C) (Oral)   BP Readings from Last 3 Encounters:  03/11/18 (!) 156/80  01/17/18 (!) 150/76  08/10/15 (!) 172/89   Pulse Readings from Last 3 Encounters:  03/11/18 67  01/17/18 73  08/10/15 77    blood pressure is 150/80 pulse 60-70 respirations 16  Well nourished, well developed male in no acute distress. Constitutional:  oriented to person, place, and time. No distress.   ASSESSMENT & PLAN:    Morbid obesity (HCC) We have encouraged continued exercise, careful diet management in an effort to lose weight.  CKD (chronic kidney disease), stage III (HCC) Creatinine 1.5, stressed importance of aggressive diabetes and cholesterol control  Essential hypertension He will monitor blood pressure at local pharmacy Does not own blood pressure cuff Suggested he call us with numbers when available  Mixed hyperlipidemia Tolerating Crestor and Zetia  Dyspnea on  exertion Reports shortness of breath has improved with better medical management Denies fluid overload Occasional leg swelling, likely chronic   COVID-19 Education: The signs and symptoms of COVID-19 were discussed with the patient and how to seek care for testing (follow up with PCP or arrange E-visit).  The importance of social distancing was discussed today.  Patient Risk:   After full review of this patients clinical status, I feel that they are at least moderate risk at this time.  Time:   Today, I have spent 25 minutes with the patient with telehealth technology discussing the cardiac and medical problems/diagnoses detailed above   10 min spent reviewing the chart prior to patient visit today   Medication Adjustments/Labs and Tests Ordered: Current medicines are reviewed at length with the patient today.  Concerns regarding medicines are outlined above.   Tests Ordered: No tests ordered   Medication Changes: No changes made   Disposition: Follow-up in 12 months   Signed, Julien Nordmann, MD  06/14/2018 3:41 PM    [facility] [address], [place], [place] [address]

## 2018-06-14 ENCOUNTER — Telehealth: Payer: Self-pay | Admitting: Cardiovascular Disease

## 2018-06-14 ENCOUNTER — Other Ambulatory Visit: Payer: Self-pay

## 2018-06-14 ENCOUNTER — Telehealth (INDEPENDENT_AMBULATORY_CARE_PROVIDER_SITE_OTHER): Payer: 59 | Admitting: Cardiovascular Disease

## 2018-06-14 DIAGNOSIS — E785 Hyperlipidemia, unspecified: Secondary | ICD-10-CM

## 2018-06-14 DIAGNOSIS — N183 Chronic kidney disease, stage 3 unspecified: Secondary | ICD-10-CM

## 2018-06-14 DIAGNOSIS — R0609 Other forms of dyspnea: Secondary | ICD-10-CM | POA: Diagnosis not present

## 2018-06-14 DIAGNOSIS — E782 Mixed hyperlipidemia: Secondary | ICD-10-CM

## 2018-06-14 DIAGNOSIS — I10 Essential (primary) hypertension: Secondary | ICD-10-CM | POA: Diagnosis not present

## 2018-06-14 NOTE — Telephone Encounter (Signed)
Spoke with patient and reviewed recommendations for virtual visit given current COVID situation. He was agreeable to this and consent reviewed. Confirmed number he would like provider to call and instructed him to try and get blood pressure reading if possible before that time. He verbalized understanding of our conversation, agreement with plan, and had no further questions.   YOUR CARDIOLOGY TEAM HAS ARRANGED FOR AN E-VISIT FOR YOUR APPOINTMENT - PLEASE REVIEW IMPORTANT INFORMATION BELOW SEVERAL DAYS PRIOR TO YOUR APPOINTMENT  Due to the recent COVID-19 pandemic, we are transitioning in-person office visits to tele-medicine visits in an effort to decrease unnecessary exposure to our patients, their families, and staff. These visits are billed to your insurance just like a normal visit is. We also encourage you to sign up for MyChart if you have not already done so. You will need a smartphone if possible. For patients that do not have this, we can still complete the visit using a regular telephone but do prefer a smartphone to enable video when possible. You may have a family member that lives with you that can help. If possible, we also ask that you have a blood pressure cuff and scale at home to measure your blood pressure, heart rate and weight prior to your scheduled appointment. Patients with clinical needs that need an in-person evaluation and testing will still be able to come to the office if absolutely necessary. If you have any questions, feel free to call our office.   CONSENT FOR TELE-HEALTH VISIT - PLEASE REVIEW  I hereby voluntarily request, consent and authorize [facility] and its employed or contracted physicians, physician assistants, nurse practitioners or other licensed health care professionals (the Practitioner), to provide me with telemedicine health care services (the "Services") as deemed necessary by the treating Practitioner. I acknowledge and consent to receive the Services  by the Practitioner via telemedicine. I understand that the telemedicine visit will involve communicating with the Practitioner through live audiovisual communication technology and the disclosure of certain medical information by electronic transmission. I acknowledge that I have been given the opportunity to request an in-person assessment or other available alternative prior to the telemedicine visit and am voluntarily participating in the telemedicine visit.  I understand that I have the right to withhold or withdraw my consent to the use of telemedicine in the course of my care at any time, without affecting my right to future care or treatment, and that the Practitioner or I may terminate the telemedicine visit at any time. I understand that I have the right to inspect all information obtained and/or recorded in the course of the telemedicine visit and may receive copies of available information for a reasonable fee.  I understand that some of the potential risks of receiving the Services via telemedicine include:  Marland Kitchen Delay or interruption in medical evaluation due to technological equipment failure or disruption; . Information transmitted may not be sufficient (e.g. poor resolution of images) to allow for appropriate medical decision making by the Practitioner; and/or  . In rare instances, security protocols could fail, causing a breach of personal health information.  Furthermore, I acknowledge that it is my responsibility to provide information about my medical history, conditions and care that is complete and accurate to the best of my ability. I acknowledge that Practitioner's advice, recommendations, and/or decision may be based on factors not within their control, such as incomplete or inaccurate data provided by me or distortions of diagnostic images or specimens that may result from electronic  transmissions. I understand that the practice of medicine is not an exact science and that Practitioner  makes no warranties or guarantees regarding treatment outcomes. I acknowledge that I will receive a copy of this consent concurrently upon execution via email to the email address I last provided but may also request a printed copy by calling the office of [facility].    I understand that my insurance will be billed for this visit.   I have read or had this consent read to me. . I understand the contents of this consent, which adequately explains the benefits and risks of the Services being provided via telemedicine.  . I have been provided ample opportunity to ask questions regarding this consent and the Services and have had my questions answered to my satisfaction. . I give my informed consent for the services to be provided through the use of telemedicine in my medical care  By participating in this telemedicine visit I agree to the above.

## 2018-06-14 NOTE — Patient Instructions (Signed)

## 2018-06-17 ENCOUNTER — Telehealth: Payer: Self-pay | Admitting: Cardiovascular Disease

## 2018-06-17 NOTE — Telephone Encounter (Signed)
BP per patient   Wednesday  6/3   135/76   62

## 2018-06-17 NOTE — Telephone Encounter (Signed)
Patient called to report BP 135/76 heart rate 62  Spoke with patient and reviewed blood pressure reading he provided. Advised that I would send that to Dr. Mariah Milling but it was good. Instructed him that if Dr. Mariah Milling had any further recommendations that I would give him a call back. Advised that if he didn't hear from me to please call us for any further questions or concerns. He verbalized understanding of our conversation, agreement with plan, and had no further questions.

## 2018-06-17 NOTE — Telephone Encounter (Signed)
LMTCB

## 2018-06-19 NOTE — Telephone Encounter (Signed)
Would recommend he stay on same meds,  Continue to check blood pressure

## 2018-06-20 NOTE — Telephone Encounter (Signed)
Spoke with patient and he verbalized understanding to stay on same medications and continue to monitor blood pressure.

## 2018-08-03 DIAGNOSIS — H401133 Primary open-angle glaucoma, bilateral, severe stage: Secondary | ICD-10-CM | POA: Diagnosis not present

## 2018-09-01 ENCOUNTER — Other Ambulatory Visit: Payer: Self-pay | Admitting: Physician Assistant

## 2018-10-14 DIAGNOSIS — E119 Type 2 diabetes mellitus without complications: Secondary | ICD-10-CM | POA: Diagnosis not present

## 2019-02-17 DIAGNOSIS — H43813 Vitreous degeneration, bilateral: Secondary | ICD-10-CM | POA: Diagnosis not present

## 2019-03-16 ENCOUNTER — Other Ambulatory Visit: Payer: Self-pay | Admitting: Physician Assistant

## 2019-06-08 ENCOUNTER — Other Ambulatory Visit: Payer: Self-pay | Admitting: Physician Assistant

## 2019-07-10 ENCOUNTER — Other Ambulatory Visit: Payer: Self-pay

## 2019-07-10 ENCOUNTER — Ambulatory Visit (INDEPENDENT_AMBULATORY_CARE_PROVIDER_SITE_OTHER): Payer: 59 | Admitting: Family

## 2019-07-10 ENCOUNTER — Other Ambulatory Visit
Admission: RE | Admit: 2019-07-10 | Discharge: 2019-07-10 | Disposition: A | Discharge status: Home / Self Care | Payer: 59 | Source: Ambulatory Visit | Attending: Family | Admitting: Family

## 2019-07-10 ENCOUNTER — Encounter: Payer: Self-pay | Admitting: Family

## 2019-07-10 VITALS — BP 130/80 | HR 86 | Ht 73.0 in | Wt 274.4 lb

## 2019-07-10 DIAGNOSIS — I25118 Atherosclerotic heart disease of native coronary artery with other forms of angina pectoris: Secondary | ICD-10-CM

## 2019-07-10 DIAGNOSIS — R0600 Dyspnea, unspecified: Secondary | ICD-10-CM | POA: Diagnosis not present

## 2019-07-10 DIAGNOSIS — N1831 Chronic kidney disease, stage 3a: Secondary | ICD-10-CM

## 2019-07-10 DIAGNOSIS — E785 Hyperlipidemia, unspecified: Secondary | ICD-10-CM | POA: Diagnosis not present

## 2019-07-10 DIAGNOSIS — I10 Essential (primary) hypertension: Secondary | ICD-10-CM

## 2019-07-10 DIAGNOSIS — R0609 Other forms of dyspnea: Secondary | ICD-10-CM

## 2019-07-10 LAB — COMPREHENSIVE METABOLIC PANEL
ALT: 16 U/L (ref 0–44)
AST: 18 U/L (ref 15–41)
Albumin: 4.1 g/dL (ref 3.5–5.0)
Alkaline Phosphatase: 75 U/L (ref 38–126)
Anion gap: 9 (ref 5–15)
BUN: 21 mg/dL (ref 8–23)
CO2: 25 mmol/L (ref 22–32)
Calcium: 8.9 mg/dL (ref 8.9–10.3)
Chloride: 104 mmol/L (ref 98–111)
Creatinine, Ser: 1.5 mg/dL — ABNORMAL HIGH (ref 0.61–1.24)
GFR calc Af Amer: 52 mL/min — ABNORMAL LOW (ref >60)
GFR calc non Af Amer: 45 mL/min — ABNORMAL LOW (ref >60)
Glucose, Bld: 103 mg/dL — ABNORMAL HIGH (ref 70–99)
Potassium: 4 mmol/L (ref 3.5–5.1)
Sodium: 138 mmol/L (ref 135–145)
Total Bilirubin: 0.5 mg/dL (ref 0.3–1.2)
Total Protein: 7.9 g/dL (ref 6.5–8.1)

## 2019-07-10 LAB — LDL CHOLESTEROL, DIRECT: Direct LDL: 90.7 mg/dL (ref 0–99)

## 2019-07-10 NOTE — Patient Instructions (Signed)
Medication Instructions:  No medication changes today.   Please call us to let know what medications you are taking. Compare with the list you were provided today. Our phone number is [phone].   *If you need a refill on your cardiac medications before your next appointment, please call your pharmacy*  Lab Work: Your physician recommends that you return for lab work today: direct LDL, CMET   If you have labs (blood work) drawn today and your tests are completely normal, you will receive your results only by: Marland Kitchen MyChart Message (if you have MyChart) OR . A paper copy in the mail If you have any lab test that is abnormal or we need to change your treatment, we will call you to review the results.   Testing/Procedures: Your EKG today shows normal sinus rhythm.   Follow-Up: At [facility], you and your health needs are our priority.  As part of our continuing mission to provide you with exceptional heart care, we have created designated Provider Care Teams.  These Care Teams include your primary Cardiologist (physician) and Advanced Practice Providers (APPs -  Physician Assistants and Nurse Practitioners) who all work together to provide you with the care you need, when you need it.  We recommend signing up for the patient portal called "MyChart".  Sign up information is provided on this After Visit Summary.  MyChart is used to connect with patients for Virtual Visits (Telemedicine).  Patients are able to view lab/test results, encounter notes, upcoming appointments, etc.  Non-urgent messages can be sent to your provider as well.   To learn more about what you can do with MyChart, go to [email or web address].    Your next appointment:   6 month(s)  The format for your next appointment:   In Person  Provider:    You may see No primary care provider on file. or one of the following Advanced Practice Providers on your designated Care Team:    Murray Hodgkins, NP  Christell Faith,  PA-C  Marrianne Mood, PA-C    Other Instructions   DASH Eating Plan DASH stands for "Dietary Approaches to Stop Hypertension." The DASH eating plan is a healthy eating plan that has been shown to reduce high blood pressure (hypertension). It may also reduce your risk for type 2 diabetes, heart disease, and stroke. The DASH eating plan may also help with weight loss. What are tips for following this plan?  General guidelines  Avoid eating more than 2,300 mg (milligrams) of salt (sodium) a day. If you have hypertension, you may need to reduce your sodium intake to 1,500 mg a day.  Limit alcohol intake to no more than 1 drink a day for nonpregnant women and 2 drinks a day for men. One drink equals 12 oz of beer, 5 oz of wine, or 1 oz of hard liquor.  Work with your health care provider to maintain a healthy body weight or to lose weight. Ask what an ideal weight is for you.  Get at least 30 minutes of exercise that causes your heart to beat faster (aerobic exercise) most days of the week. Activities may include walking, swimming, or biking.  Work with your health care provider or diet and nutrition specialist (dietitian) to adjust your eating plan to your individual calorie needs. Reading food labels   Check food labels for the amount of sodium per serving. Choose foods with less than 5 percent of the Daily Value of sodium. Generally, foods with less than 300  mg of sodium per serving fit into this eating plan.  To find whole grains, look for the word "whole" as the first word in the ingredient list. Shopping  Buy products labeled as "low-sodium" or "no salt added."  Buy fresh foods. Avoid canned foods and premade or frozen meals. Cooking  Avoid adding salt when cooking. Use salt-free seasonings or herbs instead of table salt or sea salt. Check with your health care provider or pharmacist before using salt substitutes.  Do not fry foods. Cook foods using healthy methods such as  baking, boiling, grilling, and broiling instead.  Cook with heart-healthy oils, such as olive, canola, soybean, or sunflower oil. Meal planning  Eat a balanced diet that includes: ? 5 or more servings of fruits and vegetables each day. At each meal, try to fill half of your plate with fruits and vegetables. ? Up to 6-8 servings of whole grains each day. ? Less than 6 oz of lean meat, poultry, or fish each day. A 3-oz serving of meat is about the same size as a deck of cards. One egg equals 1 oz. ? 2 servings of low-fat dairy each day. ? A serving of nuts, seeds, or beans 5 times each week. ? Heart-healthy fats. Healthy fats called Omega-3 fatty acids are found in foods such as flaxseeds and coldwater fish, like sardines, salmon, and mackerel.  Limit how much you eat of the following: ? Canned or prepackaged foods. ? Food that is high in trans fat, such as fried foods. ? Food that is high in saturated fat, such as fatty meat. ? Sweets, desserts, sugary drinks, and other foods with added sugar. ? Full-fat dairy products.  Do not salt foods before eating.  Try to eat at least 2 vegetarian meals each week.  Eat more home-cooked food and less restaurant, buffet, and fast food.  When eating at a restaurant, ask that your food be prepared with less salt or no salt, if possible. What foods are recommended? The items listed may not be a complete list. Talk with your dietitian about what dietary choices are best for you. Grains Whole-grain or whole-wheat bread. Whole-grain or whole-wheat pasta. Brown rice. Orpah Cobb. Bulgur. Whole-grain and low-sodium cereals. Pita bread. Low-fat, low-sodium crackers. Whole-wheat flour tortillas. Vegetables Fresh or frozen vegetables (raw, steamed, roasted, or grilled). Low-sodium or reduced-sodium tomato and vegetable juice. Low-sodium or reduced-sodium tomato sauce and tomato paste. Low-sodium or reduced-sodium canned vegetables. Fruits All fresh,  dried, or frozen fruit. Canned fruit in natural juice (without added sugar). Meat and other protein foods Skinless chicken or [place]. Ground chicken or [place]. Pork with fat trimmed off. Fish and seafood. Egg whites. Dried beans, peas, or lentils. Unsalted nuts, nut butters, and seeds. Unsalted canned beans. Lean cuts of beef with fat trimmed off. Low-sodium, lean deli meat. Dairy Low-fat (1%) or fat-free (skim) milk. Fat-free, low-fat, or reduced-fat cheeses. Nonfat, low-sodium ricotta or cottage cheese. Low-fat or nonfat yogurt. Low-fat, low-sodium cheese. Fats and oils Soft margarine without trans fats. Vegetable oil. Low-fat, reduced-fat, or light mayonnaise and salad dressings (reduced-sodium). Canola, safflower, olive, soybean, and sunflower oils. Avocado. Seasoning and other foods Herbs. Spices. Seasoning mixes without salt. Unsalted popcorn and pretzels. Fat-free sweets. What foods are not recommended? The items listed may not be a complete list. Talk with your dietitian about what dietary choices are best for you. Grains Baked goods made with fat, such as croissants, muffins, or some breads. Dry pasta or rice meal packs. Vegetables Creamed or  fried vegetables. Vegetables in a cheese sauce. Regular canned vegetables (not low-sodium or reduced-sodium). Regular canned tomato sauce and paste (not low-sodium or reduced-sodium). Regular tomato and vegetable juice (not low-sodium or reduced-sodium). Rosita Fire. Olives. Fruits Canned fruit in a light or heavy syrup. Fried fruit. Fruit in cream or butter sauce. Meat and other protein foods Fatty cuts of meat. Ribs. Fried meat. Tomasa Blase. Sausage. Bologna and other processed lunch meats. Salami. Fatback. Hotdogs. Bratwurst. Salted nuts and seeds. Canned beans with added salt. Canned or smoked fish. Whole eggs or egg yolks. Chicken or [place] with skin. Dairy Whole or 2% milk, cream, and half-and-half. Whole or full-fat cream cheese. Whole-fat or sweetened  yogurt. Full-fat cheese. Nondairy creamers. Whipped toppings. Processed cheese and cheese spreads. Fats and oils Butter. Stick margarine. Lard. Shortening. Ghee. Bacon fat. Tropical oils, such as coconut, palm kernel, or palm oil. Seasoning and other foods Salted popcorn and pretzels. Onion salt, garlic salt, seasoned salt, table salt, and sea salt. Worcestershire sauce. Tartar sauce. Barbecue sauce. Teriyaki sauce. Soy sauce, including reduced-sodium. Steak sauce. Canned and packaged gravies. Fish sauce. Oyster sauce. Cocktail sauce. Horseradish that you find on the shelf. Ketchup. Mustard. Meat flavorings and tenderizers. Bouillon cubes. Hot sauce and Tabasco sauce. Premade or packaged marinades. Premade or packaged taco seasonings. Relishes. Regular salad dressings. Where to find more information:  National Heart, Lung, and Blood Institute: PopSteam.is  American Heart Association: www.heart.org Summary  The DASH eating plan is a healthy eating plan that has been shown to reduce high blood pressure (hypertension). It may also reduce your risk for type 2 diabetes, heart disease, and stroke.  With the DASH eating plan, you should limit salt (sodium) intake to 2,300 mg a day. If you have hypertension, you may need to reduce your sodium intake to 1,500 mg a day.  When on the DASH eating plan, aim to eat more fresh fruits and vegetables, whole grains, lean proteins, low-fat dairy, and heart-healthy fats.  Work with your health care provider or diet and nutrition specialist (dietitian) to adjust your eating plan to your individual calorie needs. This information is not intended to replace advice given to you by your health care provider. Make sure you discuss any questions you have with your health care provider. Document Revised: 12/11/2016 Document Reviewed: 12/23/2015 Elsevier Patient Education  2020 [employer].

## 2019-07-10 NOTE — Progress Notes (Signed)
Office Visit    Patient Name: Garrett Frank Date of Encounter: 07/10/2019  Primary Care Provider:  Leanna Sato, MD Primary Cardiologist:  Julien Nordmann, MD Electrophysiologist:  None   Chief Complaint    Garrett Frank is a 74 y.o. male with a hx of CAD, DM2, HTN radiology, read to be dyspnea, prior tobacco use quitting 1995, nephrolithiasis, gout presents today for follow-up of CAD, HTN, HLD.  Past Medical History    Past Medical History:  Diagnosis Date  . Diabetes mellitus   . Gout   . HTN (hypertension)   . Morbid obesity (HCC)   . Nephrolithiasis    Past Surgical History:  Procedure Laterality Date  . CHOLECYSTECTOMY      Allergies  Allergies  Allergen Reactions  . Statins     Does not work well  . Sulfonamide Derivatives   . Sulfur Hives    History of Present Illness    Garrett Frank is a 74 y.o. male with a hx of reported CAD, DM2, HTN radiology, read to be dyspnea, prior tobacco use quitting 1995, nephrolithiasis, gout last seen via telemedicine 06/2018.  Previously followed by Dr. Eldridge Dace last having seen him in 2016 and then established with [facility].  Previous evaluation by Dr. Vladimir Faster in 2012 with exercise Myoview that showed fair exercise tolerance.  Myoview with no clear evidence of stress-induced myocardial ischemia with EF 65%.  Underwent echo 10/2010 with Dr. Sanda Linger showing mild mitral sclerosis, mild MR, mild tricuspid regurgitation, RVSP 20 mmHg.  Noted be technically difficult study.  [facility] 12/2017 for routine follow-up with noted to be hypertensive with blood pressure 155/95.  Given his medical history he was referred to cardiology for optimization of cardiovascular risk factors.  Seen in [place] office 01/2018 to establish care.  Noted previously been told he had regular heartbeat but never had formal evaluation to his knowledge.  His Toprol was increased to 75 mg daily.  He was noted to have statin intolerance that  was agreeable to start Crestor 5 mg daily.  Given chronic dyspnea underwent echo 02/04/2018 with LVEF 55 to 60%, normal wall motion, grade 1 diastolic dysfunction, normal left atrium, normal RV systolic function, normal PASP.   Seen in clinic 02/2018 with noted myalgias on Crestor 5 mg daily.  He was started on Zetia 10 mg daily  Reports feeling overall well.  Reports stable exertional dyspnea.  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  No formal exercise routine.  Endorses trying to eat a low-sodium, healthy diet.  Just got back from the weekend at the beach visiting his brother in law with his daughter.  Reports no shortness of breath at rest. Reports no chest pain, pressure, or tightness. No edema, orthopnea, PND. Reports no palpitations.    EKGs/Labs/Other Studies Reviewed:   The following studies were reviewed today:   EKG:  EKG is ordered today.  The ekg ordered today demonstrates NSR 86 bpm with nonspecific T wave abnormality, no acute St/T wave changes.   Recent Labs: 07/10/2019: ALT 16; BUN 21; Creatinine, Ser 1.50; Potassium 4.0; Sodium 138  Recent Lipid Panel    Component Value Date/Time   CHOL 158 03/11/2018 1458   TRIG 128 03/11/2018 1458   HDL 34 (L) 03/11/2018 1458   CHOLHDL 4.6 03/11/2018 1458   CHOLHDL 4.8 01/17/2018 1657   VLDL 31 01/17/2018 1657   LDLCALC 98 03/11/2018 1458   LDLDIRECT 90.7 07/10/2019 1654  Home Medications   Current Meds  Medication Sig  . amLODipine (NORVASC) 10 MG tablet Take 10 mg by mouth daily.  Marland Kitchen aspirin 81 MG EC tablet Take 81 mg by mouth daily.    . colchicine 0.6 MG tablet Take 0.6 mg by mouth daily. Take 2 tablets by mouth on 1st onset of GOUT flare, follow in 1 hour with 1 tablet (maximum 3 tablets in a hour)  . docusate sodium (COLACE) 100 MG capsule Take 100 mg by mouth daily as needed for mild constipation.  . dorzolamide-timolol (COSOPT) 22.3-6.8 MG/ML ophthalmic solution Place 1 drop into both eyes 2 (two)  times daily.  Marland Kitchen ezetimibe (ZETIA) 10 MG tablet TAKE 1 TABLET BY MOUTH EVERY DAY  . furosemide (LASIX) 40 MG tablet 1 tablet daily.  . hydrALAZINE (APRESOLINE) 100 MG tablet Take 1 tablet (100 mg total) by mouth 3 (three) times daily.  Marland Kitchen latanoprost (XALATAN) 0.005 % ophthalmic solution Place 1 drop into both eyes at bedtime.  Marland Kitchen linagliptin (TRADJENTA) 5 MG TABS tablet Take 5 mg by mouth daily.  Marland Kitchen losartan (COZAAR) 100 MG tablet Take 1 tablet by mouth daily.  . metoprolol succinate (TOPROL-XL) 25 MG 24 hr tablet TAKE 3 TABLETS (75 MG TOTAL) BY MOUTH DAILY. TAKE WITH OR IMMEDIATELY FOLLOWING A MEAL.  Marland Kitchen ONE TOUCH ULTRA TEST test strip CHECK BLOOD SUGAR TWICE A DAY AS DIRECTED DX CODE: 250.02  . rosuvastatin (CRESTOR) 5 MG tablet Take 1 tablet (5 mg total) by mouth at bedtime.      Review of Systems      Review of Systems  Constitutional: Negative for chills, fever and malaise/fatigue.  Cardiovascular: Positive for dyspnea on exertion. Negative for chest pain, leg swelling, near-syncope, orthopnea, palpitations and syncope.  Respiratory: Negative for cough, shortness of breath and wheezing.   Gastrointestinal: Negative for nausea and vomiting.  Neurological: Negative for dizziness, light-headedness and weakness.   All other systems reviewed and are otherwise negative except as noted above.  Physical Exam    VS:  BP 130/80 (BP Location: Left Arm, Patient Position: Sitting, Cuff Size: Large)   Pulse 86   Ht 6\' 1"  (1.854 m)   Wt 274 lb 6 oz (124.5 kg)   SpO2 98%   BMI 36.20 kg/m  , BMI Body mass index is 36.2 kg/m. GEN: Well nourished, well developed, in no acute distress. HEENT: normal. Neck: Supple, no JVD, carotid bruits, or masses. Cardiac: RRR, no murmurs, rubs, or gallops. No clubbing, cyanosis, edema.  Radials/DP/PT 2+ and equal bilaterally.  Respiratory:  Respirations regular and unlabored, clear to auscultation bilaterally. GI: Soft, nontender, nondistended, BS + x 4. MS: No  deformity or atrophy. Skin: Warm and dry, no rash. Neuro:  Strength and sensation are intact. Psych: Normal affect.   Assessment & Plan    1. Reported CAD -reports no anginal symptoms.  EKG today with no acute ST/T wave changes.  No indication for ischemic evaluation at this time.  GDMT includes aspirin, beta-blocker.  He cannot recall whether he is taking his metoprolol and tells me he will check his medication bottles when he gets home and call our office to confirm.  Previously intolerant of statin.  Continue Zetia.  Recommend regular cardiovascular exercise.  2. Morbid obesity - Weight loss via diet and exercise encouraged.  Would likely benefit from outpatient sleep study, defer to PCP.  3. CKD III -careful titration of antihypertensives and diuretics.  May benefit from future referral to nephrology.  Will defer  to PCP.CMP today.   4. HTN -blood pressure well controlled.  Continue present antihypertensive regimen.  Recommend low-sodium, healthy diet.  5. HLD, LDL less than 70 -   Previous intolerance of Lipitor and Crestor due to myalgias and arthralgias.  He has declined to try additional statin.  Continue Zetia 10 mg daily.  Direct LDL, CMP today.  Rosuvastatin is on his medication list and he is unclear if he is taking at home, he will check his medication bottles and let us know whether he is taking it.  6. DOE - Longstanding history.  Echo 01/2018 with preserved LVEF, grade 1 diastolic dysfunction, no regional wall motion abnormalities.  Likely etiology deconditioning.  No indication for further evaluation at this time.  Disposition: Follow up in 6 month(s) with Dr. Mariah Milling or APP   Alver Sorrow, NP 07/10/2019, 9:23 PM

## 2019-07-11 ENCOUNTER — Telehealth: Payer: Self-pay

## 2019-07-11 ENCOUNTER — Telehealth: Payer: Self-pay | Admitting: Cardiovascular Disease

## 2019-07-11 NOTE — Telephone Encounter (Signed)
Patient is calling back in to verify his medications after his appointment yesterday Patient is taking: Rosuvastatin- does not take anymore Metoprolol- does not take anymore glipizide ER 10 mg- does take this, not noted in chart (1 tablet by mouth daily for diabetic)

## 2019-07-11 NOTE — Telephone Encounter (Signed)
Attempted to call patient. LMTCB 07/11/2019   

## 2019-07-11 NOTE — Telephone Encounter (Signed)
-----   Message from Alver Sorrow, NP sent at 07/10/2019 10:05 PM EDT ----- Stable renal function. Normal electrolytes, liver function. LDL remains above goal of 70. Please confirm he is taking Zetia 10mg  daily. If not, resume.   He had Crestor on med list in office - low suspicion he is taking. Please confirm and remove from med list.

## 2019-07-12 NOTE — Telephone Encounter (Signed)
Okay to remain off Crestor. He previously had myalgias so I did not think he was taking. Please add to medication intolerance list. LDL was 90 on recent labs, recommend continuing Zetia and lipid-lowering diet.   Recommend resuming Metoprolol for his history of CAD and HTN. The Metoprolol helps his heart to relax. It helps lower his blood pressure, heart rate, and protect his heart function. As his BP was reasonable in clinic we can start at reduced dose of Toprol 25mg  daily.   TY! , NP

## 2019-07-12 NOTE — Telephone Encounter (Signed)
Left voicemail message to call back for review of recommendations.  

## 2019-07-21 MED ORDER — METOPROLOL SUCCINATE ER 25 MG PO TB24: 25.0000 mg | ORAL_TABLET | Freq: Every day | ORAL | 3 refills | Status: DC

## 2019-07-21 NOTE — Telephone Encounter (Signed)
Spoke with patient and reviewed provider recommendations to restart metoprolol 25 mg once daily. Discontinued and listed crestor as intolerance. He was appreciative for the call back with no further questions at this time.

## 2019-07-28 NOTE — Telephone Encounter (Signed)
Call to patient to review lab results and confirm current medications.   He reported he is at work and will call when he has access to medications to confirm.

## 2019-08-01 NOTE — Telephone Encounter (Signed)
Call to patient to review results and POC. No answer, LMTCB.

## 2019-08-10 NOTE — Telephone Encounter (Signed)
Attempted to call patient. LMTCB 08/10/2019

## 2019-08-11 NOTE — Telephone Encounter (Signed)
Attempted to reach patient multiple times. Sent letter to patient to remind him to call back to discuss POC.   Encounter completed at this time.

## 2019-08-13 ENCOUNTER — Other Ambulatory Visit: Payer: Self-pay | Admitting: Physician Assistant

## 2019-08-16 DIAGNOSIS — H401133 Primary open-angle glaucoma, bilateral, severe stage: Secondary | ICD-10-CM | POA: Diagnosis not present

## 2019-08-21 ENCOUNTER — Telehealth: Payer: Self-pay | Admitting: Nurse Practitioner

## 2019-08-21 NOTE — Telephone Encounter (Signed)
Please call to discuss Metoprolol. States it has been giving him back pain and he is retaining fluid. Also states he has been constipated. States his right leg is sore around his knee. States he will stop taking this medication.

## 2019-08-21 NOTE — Telephone Encounter (Signed)
Patient request a call back between 3:30 and 4

## 2019-08-22 NOTE — Telephone Encounter (Signed)
Low suspicion the Metoprolol is causing his knee pain, constipation. Those are not side effects of Metoprolol. Recommend he continue Metoprolol Succinate 25mg  daily for protection of his coronary arteries and heart function. We did start him on a reduced dose compared to previous and he has tolerated higher doses well. Recommend he follow up with his PCP regarding his knee and constipation.   , NP

## 2019-08-22 NOTE — Telephone Encounter (Signed)
Spoke to patient. Reports he had 3 days without a bowel movement.  He had to take some Cod Liver Oil to help aid in the BM. He believes this has been exacerbated by the metoprolol now that it is "in his system."  Around 07/12/19, he resumed metoprolol at 25 mg once a day. Other symptoms include retaining of fluid and pain in his knee. Advised that he has taken metoprolol for extended periods of time over the years and asked if he had these symptoms at that time. He was unclear and did not really answer the question. I do not think he did.  His last dose was yesterday and he does not want to continue it. Advised I will send to provider for further advice and let him know. Routing to Gillian Shields, NP.

## 2019-08-22 NOTE — Telephone Encounter (Signed)
Patient notified and verbalized understanding to continue the metoprolol and contact PCP about the knee and constipation.

## 2019-11-09 ENCOUNTER — Other Ambulatory Visit: Payer: Self-pay | Admitting: Family

## 2020-01-09 NOTE — Progress Notes (Signed)
Date:  01/10/2020   ID:  Garrett Frank, DOB 1945/06/16, MRN [id number]  Patient Location:  [address] [place] [address]   Provider location:   [facility],  office  PCP:  Leanna Sato, MD  Cardiologist:  [facility]  Chief Complaint  Patient presents with  . Follow-up    6 month. "doing well."     History of Present Illness:    Garrett Frank is a 74 y.o. male past medical history of DM2,  hypertension,  hyperlipidemia,  morbid obesity, prior tobacco abuse quitting in 1995 nephrolithiasis,  Chronic lower extremity swelling and gout who presents for follow-up of his CAD, hypertension, and hyperlipidemia.  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  Reports unable to tolerate metoprolol, felt it caused myalgias He stopped the medication, was changed to carvedilol by primary care Again reported having myalgias  Heart rate at rest in the office today over 100 bpm No improvement with rest by the end of his visit  Denies significant shortness of breath on exertion Trace leg swelling  Has not been monitoring blood pressure at home No chest pain on exertion concerning for angina  Followed by the [facility] clinic Lab work reviewed Normal LFTs Hemoglobin A1c 6.6 Total cholesterol 149 LDL 78  EKG personally reviewed by myself on todays visit Shows sinus tachycardia rate 105 bpm nonspecific T wave abnormality V4 through V6 2, aVF  Other past medical history reviewed echo on 02/04/2018 that showed an EF of 55 to 65%, normal wall motion, grade 1 diastolic dysfunction, normal size left atrium, normal RV systolic function, PASP normal.    Past Medical History:  Diagnosis Date  . Diabetes mellitus   . Gout   . HTN (hypertension)   . Morbid obesity (HCC)   . Nephrolithiasis    Past Surgical History:  Procedure  Laterality Date  . CHOLECYSTECTOMY       Current Meds  Medication Sig  . amLODipine (NORVASC) 10 MG tablet Take 10 mg by mouth daily.  Marland Kitchen aspirin 81 MG EC tablet Take 81 mg by mouth daily.  . colchicine 0.6 MG tablet Take 0.6 mg by mouth daily. Take 2 tablets by mouth on 1st onset of GOUT flare, follow in 1 hour with 1 tablet (maximum 3 tablets in a hour)  . docusate sodium (COLACE) 100 MG capsule Take 100 mg by mouth daily as needed for mild constipation.  . dorzolamide-timolol (COSOPT) 22.3-6.8 MG/ML ophthalmic solution Place 1 drop into both eyes 2 (two) times daily.  Marland Kitchen ezetimibe (ZETIA) 10 MG tablet TAKE 1 TABLET BY MOUTH EVERY DAY  . furosemide (LASIX) 40 MG tablet 1 tablet daily.  Marland Kitchen glipiZIDE (GLUCOTROL XL) 10 MG 24 hr tablet Take 10 mg by mouth daily with breakfast.  . hydrALAZINE (APRESOLINE) 100 MG tablet Take 1 tablet (100 mg total) by mouth 3 (three) times daily.  Marland Kitchen latanoprost (XALATAN) 0.005 % ophthalmic solution Place 1 drop into both eyes at bedtime.  Marland Kitchen linagliptin (TRADJENTA) 5 MG TABS tablet Take 5 mg by mouth daily.  Marland Kitchen losartan (COZAAR) 100 MG tablet Take 1 tablet by mouth daily.  . metoprolol succinate (TOPROL-XL) 25 MG 24 hr tablet TAKE 1 TABLET BY MOUTH EVERY DAY  . ONE TOUCH ULTRA TEST test strip CHECK BLOOD SUGAR TWICE A DAY AS DIRECTED DX CODE:  250.02     Allergies:   Crestor [rosuvastatin calcium], Statins, Sulfonamide derivatives, and Sulfur   Social History   Tobacco Use  . Smoking status: Former Smoker    Packs/day: 0.50    Years: 15.00    Pack years: 7.50    Types: Cigarettes  . Smokeless tobacco: Never Used  Vaping Use  . Vaping Use: Never used  Substance Use Topics  . Alcohol use: Yes    Comment: ocassionally  . Drug use: No     Current Outpatient Medications on File Prior to Visit  Medication Sig Dispense Refill  . amLODipine (NORVASC) 10 MG tablet Take 10 mg by mouth daily.    Marland Kitchen aspirin 81 MG EC tablet Take 81 mg by mouth daily.    .  colchicine 0.6 MG tablet Take 0.6 mg by mouth daily. Take 2 tablets by mouth on 1st onset of GOUT flare, follow in 1 hour with 1 tablet (maximum 3 tablets in a hour)    . docusate sodium (COLACE) 100 MG capsule Take 100 mg by mouth daily as needed for mild constipation.    . dorzolamide-timolol (COSOPT) 22.3-6.8 MG/ML ophthalmic solution Place 1 drop into both eyes 2 (two) times daily.    Marland Kitchen ezetimibe (ZETIA) 10 MG tablet TAKE 1 TABLET BY MOUTH EVERY DAY 90 tablet 1  . furosemide (LASIX) 40 MG tablet 1 tablet daily. 30 tablet 6  . glipiZIDE (GLUCOTROL XL) 10 MG 24 hr tablet Take 10 mg by mouth daily with breakfast.    . hydrALAZINE (APRESOLINE) 100 MG tablet Take 1 tablet (100 mg total) by mouth 3 (three) times daily. 90 tablet 11  . latanoprost (XALATAN) 0.005 % ophthalmic solution Place 1 drop into both eyes at bedtime.  5  . linagliptin (TRADJENTA) 5 MG TABS tablet Take 5 mg by mouth daily.    Marland Kitchen losartan (COZAAR) 100 MG tablet Take 1 tablet by mouth daily.    . metoprolol succinate (TOPROL-XL) 25 MG 24 hr tablet TAKE 1 TABLET BY MOUTH EVERY DAY 90 tablet 0  . ONE TOUCH ULTRA TEST test strip CHECK BLOOD SUGAR TWICE A DAY AS DIRECTED DX CODE: 250.02  11   No current facility-administered medications on file prior to visit.     Family Hx: The patient's family history includes Aneurysm (age of onset: 22) in his mother; Hypertension in his father; Kidney failure in his father. There is no history of Coronary artery disease.  ROS:   Please see the history of present illness.    Review of Systems  Constitutional: Negative.   HENT: Negative.   Respiratory: Negative.   Cardiovascular: Negative.   Gastrointestinal: Negative.   Musculoskeletal: Negative.   Neurological: Negative.   Psychiatric/Behavioral: Negative.   All other systems reviewed and are negative.    Labs/Other Tests and Data Reviewed:    Recent Labs: 07/10/2019: ALT 16; BUN 21; Creatinine, Ser 1.50; Potassium 4.0; Sodium 138    Recent Lipid Panel Lab Results  Component Value Date/Time   CHOL 158 03/11/2018 02:58 PM   TRIG 128 03/11/2018 02:58 PM   HDL 34 (L) 03/11/2018 02:58 PM   CHOLHDL 4.6 03/11/2018 02:58 PM   CHOLHDL 4.8 01/17/2018 04:57 PM   LDLCALC 98 03/11/2018 02:58 PM   LDLDIRECT 90.7 07/10/2019 04:54 PM    Wt Readings from Last 3 Encounters:  01/10/20 280 lb 2 oz (127.1 kg)  07/10/19 274 lb 6 oz (124.5 kg)  03/11/18 285 lb (129.3 kg)  Exam:    Vital Signs: Vital signs may also be detailed in the HPI BP 130/82 (BP Location: Left Arm, Patient Position: Sitting, Cuff Size: Normal)   Pulse (!) 105   Ht 6\' 1"  (1.854 m)   Wt 280 lb 2 oz (127.1 kg)   SpO2 98%   BMI 36.96 kg/m   Constitutional:  oriented to person, place, and time. No distress.  HENT:  Head: Grossly normal Eyes:  no discharge. No scleral icterus.  Neck: No JVD, no carotid bruits  Cardiovascular: Regular rate and rhythm, no murmurs appreciated Pulmonary/Chest: Clear to auscultation bilaterally, no wheezes or rails Abdominal: Soft.  no distension.  no tenderness.  Musculoskeletal: Normal range of motion Neurological:  normal muscle tone. Coordination normal. No atrophy Skin: Skin warm and dry Psychiatric: normal affect, pleasant   ASSESSMENT & PLAN:    Morbid obesity (HCC) We have encouraged continued exercise, careful diet management in an effort to lose weight.  CKD (chronic kidney disease), stage III (HCC) Stable renal function, done through primary care  Sinus tachycardia Reports unable to tolerate metoprolol succinate, carvedilol even at very low dose secondary to myalgias We will try bisoprolol 5 mg daily in the p.m. If he has continued atypical symptoms, Could potentially try diltiazem or verapamil in place of amlodipine  Essential hypertension Start of bisoprolol as above Continue other medications  Mixed hyperlipidemia Tolerating Crestor and Zetia Numbers at goal  Dyspnea on exertion Chronic  mild shortness of breath, will work on sinus tachycardia   Total encounter time more than 25 minutes  Greater than 50% was spent in counseling and coordination of care with the patient   Signed, , MD  01/10/2020 4:55 PM    [facility] [address], [place], [place]

## 2020-01-10 ENCOUNTER — Encounter: Payer: Self-pay | Admitting: Cardiovascular Disease

## 2020-01-10 ENCOUNTER — Other Ambulatory Visit: Payer: Self-pay

## 2020-01-10 ENCOUNTER — Ambulatory Visit (INDEPENDENT_AMBULATORY_CARE_PROVIDER_SITE_OTHER): Payer: 59 | Admitting: Cardiovascular Disease

## 2020-01-10 VITALS — BP 130/82 | HR 105 | Ht 73.0 in | Wt 280.1 lb

## 2020-01-10 DIAGNOSIS — E782 Mixed hyperlipidemia: Secondary | ICD-10-CM

## 2020-01-10 DIAGNOSIS — N1831 Chronic kidney disease, stage 3a: Secondary | ICD-10-CM

## 2020-01-10 DIAGNOSIS — I25118 Atherosclerotic heart disease of native coronary artery with other forms of angina pectoris: Secondary | ICD-10-CM

## 2020-01-10 DIAGNOSIS — R0600 Dyspnea, unspecified: Secondary | ICD-10-CM

## 2020-01-10 DIAGNOSIS — R0609 Other forms of dyspnea: Secondary | ICD-10-CM

## 2020-01-10 DIAGNOSIS — E785 Hyperlipidemia, unspecified: Secondary | ICD-10-CM | POA: Diagnosis not present

## 2020-01-10 MED ORDER — BISOPROLOL FUMARATE 5 MG PO TABS: 5.0000 mg | ORAL_TABLET | Freq: Every day | ORAL | 6 refills | Status: DC

## 2020-01-10 NOTE — Patient Instructions (Signed)
Medication Instructions:  Please start bisoprolol 5 mg daily,  Take in the evening  If you need a refill on your cardiac medications before your next appointment, please call your pharmacy.    Lab work: No new labs needed   If you have labs (blood work) drawn today and your tests are completely normal, you will receive your results only by: Marland Kitchen MyChart Message (if you have MyChart) OR . A paper copy in the mail If you have any lab test that is abnormal or we need to change your treatment, we will call you to review the results.   Testing/Procedures: No new testing needed   Follow-Up: At [facility], you and your health needs are our priority.  As part of our continuing mission to provide you with exceptional heart care, we have created designated Provider Care Teams.  These Care Teams include your primary Cardiologist (physician) and Advanced Practice Providers (APPs -  Physician Assistants and Nurse Practitioners) who all work together to provide you with the care you need, when you need it.  . You will need a follow up appointment in 12 months  . Providers on your designated Care Team:   . Nicolasa Ducking, NP . Eula Listen, PA-C . Marisue Ivan, PA-C  Any Other Special Instructions Will Be Listed Below (If Applicable).  COVID-19 Vaccine Information can be found at: [email or web address] For questions related to vaccine distribution or appointments, please email [email or web address] or call [phone].

## 2020-02-18 ENCOUNTER — Other Ambulatory Visit: Payer: Self-pay | Admitting: Cardiovascular Disease

## 2020-04-23 ENCOUNTER — Telehealth: Payer: Self-pay | Admitting: Cardiovascular Disease

## 2020-04-23 NOTE — Telephone Encounter (Signed)
Pt c/o medication issue:  1. Name of Medication: bisoprolol    2. How are you currently taking this medication (dosage and times per day)? Holding   3. Are you having a reaction (difficulty breathing--STAT)? Muscle soreness pain and difficulty ambulating   4. What is your medication issue? Cannot tolerate and stopped taking

## 2020-04-23 NOTE — Telephone Encounter (Signed)
Spoke with the patient. Patient sts that he started the prescribed bisoprolol in Dec 2021. After a week or two he started to develop pain and soreness in his muscles. He stopped the medication and his symptoms resolved.  Patient sts that last week he decided to restart bisoprolol. Patient sts that a couple of days later the same symptoms returned.  His last dosage of bisoprolol was taken this morning. Patient sts that he is stopping the medication. Adv the patient that I will fwd the update to Dr. Mariah Milling. He can HOLD bisoprolol until he hear back.  Patient agreeable with the plan and voiced appreciation for the call back.

## 2020-04-23 NOTE — Telephone Encounter (Signed)
Numerous issues with b-blockers, metoprolol, coreg and now bisoprolol All giving myalgias  Can we change his amlodipine to diltiazem er 180 mg daily Thx TG

## 2020-04-24 MED ORDER — DILTIAZEM HCL ER COATED BEADS 180 MG PO CP24: 180.0000 mg | ORAL_CAPSULE | Freq: Every day | ORAL | 3 refills | Status: DC

## 2020-04-24 NOTE — Telephone Encounter (Signed)
Was able to return [name] phone call in regards to his BP medication, he has already stopped the bisoprolol, Dr. Mariah Milling advised  "Numerous issues with b-blockers, metoprolol, coreg and now bisoprolol All giving myalgias Can we change his amlodipine to diltiazem er 180 mg daily"  [name] is agreeable with plan, educated pt on potential side affects as he inquired, advised to try medication to see if there is any reactions, if so to call back and we can make necessary adjustments as needed. Pt verbalized understanding.   Pt's med list has been updated as well to reflect changes.

## 2020-05-03 ENCOUNTER — Telehealth: Payer: Self-pay | Admitting: Cardiovascular Disease

## 2020-05-03 NOTE — Telephone Encounter (Signed)
Was able to return pt's call regarding his medication. Mr. Almanza was switch from his amlodipine to diltiazem on 4/12, he got the medication filled on 4/13 last Wed, he started experiencing "numb all over, like I am "drunk", and my stomach is upset" reports diarrhea, no vomiting. Stated this all started yesterday and when he tool his morning dose of Diltiazem ER 180 mg this am "it intensify" the numbness. Advised pt that dizziness and lightheadedness is a common side affect of the drug, should move slow, no sudden movement, stay hydrated as well. Pt wanted to know about other drugs d/t side affects.  Advised pt that there has been many switches to his cardiac medications d/t side affects and not being able to tolerate different heart meds. Would half to let Dr. Mariah Milling weight in on what to do next. Advised pt will send Dr. Mariah Milling a message for recommendation, but in the mean time try to stick with taking medication to stabilize his HR and move slow during times of feeling "drunk" and numbness,  if for any reason he decides to stop med over the weekend d/t side affects then to CLOSELY monitor his HR and BP as well, don't need to have an off-set in rhythm and there be a possibility of throwing a clot. Pt verbalized understanding, will wait for Dr. Windell Hummingbird advice, understands it may be this up coming Monday since Mariah Milling is making hospital rounds and the weekend starts tomorrow. Again, pt verbalized understanding, but is grateful for the return phone call.

## 2020-05-03 NOTE — Telephone Encounter (Signed)
Patient states he was given a medication by Dr. Mariah Milling (not sure the name of it), and he states it is making him feel "numb" all over. Please call to discuss.

## 2020-05-05 NOTE — Telephone Encounter (Signed)
Hold diltiazem Go back on amlodipine Running out of medications Numerous intolerances

## 2020-05-06 MED ORDER — AMLODIPINE BESYLATE 10 MG PO TABS: 10.0000 mg | ORAL_TABLET | Freq: Every day | ORAL | 3 refills | Status: AC

## 2020-05-06 NOTE — Addendum Note (Signed)
Addended by: Maple Hudson on: 05/06/2020 08:49 AM   Modules accepted: Orders

## 2020-05-06 NOTE — Telephone Encounter (Signed)
Was able to reach back out to Mr. Becvar regarding his medications and his intolerance to diltiazem, advised him of Dr. Windell Hummingbird recommendation:   "Hold diltiazem Go back on amlodipine Running out of medications Numerous intolerances"  Pt is agreeable with restarting his amlodipine 10 mg daily. Will update pt's medication list to reflect med change, script sent in to pharmacy, pt will call when refills are needed since he still has some amlodipine on hand. Nothing further at this time, pt will call back with any concerns and will continue to monitor BP & HR.

## 2020-05-12 ENCOUNTER — Other Ambulatory Visit: Payer: Self-pay | Admitting: Cardiovascular Disease

## 2020-05-16 ENCOUNTER — Other Ambulatory Visit: Payer: Self-pay | Admitting: Cardiovascular Disease

## 2020-06-12 ENCOUNTER — Other Ambulatory Visit: Payer: Self-pay | Admitting: Cardiovascular Disease

## 2021-07-11 ENCOUNTER — Other Ambulatory Visit: Payer: Self-pay | Admitting: Family Medicine

## 2021-07-11 ENCOUNTER — Ambulatory Visit
Admission: RE | Admit: 2021-07-11 | Discharge: 2021-07-11 | Disposition: A | Discharge status: Home / Self Care | Payer: Medicare Other | Source: Ambulatory Visit | Attending: Family Medicine | Admitting: Family Medicine

## 2021-07-11 DIAGNOSIS — M4802 Spinal stenosis, cervical region: Secondary | ICD-10-CM | POA: Diagnosis not present

## 2021-07-11 DIAGNOSIS — M542 Cervicalgia: Secondary | ICD-10-CM | POA: Insufficient documentation

## 2021-07-11 DIAGNOSIS — M4312 Spondylolisthesis, cervical region: Secondary | ICD-10-CM | POA: Insufficient documentation

## 2021-07-11 DIAGNOSIS — M47812 Spondylosis without myelopathy or radiculopathy, cervical region: Secondary | ICD-10-CM | POA: Diagnosis not present

## 2021-08-25 ENCOUNTER — Telehealth: Payer: Self-pay | Admitting: Cardiovascular Disease

## 2021-08-25 NOTE — Telephone Encounter (Signed)
Attempted to schedule 3 times, deleting from recall. 

## 2023-11-04 ENCOUNTER — Other Ambulatory Visit: Payer: Self-pay | Admitting: Family Medicine

## 2023-11-04 DIAGNOSIS — R634 Abnormal weight loss: Secondary | ICD-10-CM

## 2023-11-04 DIAGNOSIS — R29898 Other symptoms and signs involving the musculoskeletal system: Secondary | ICD-10-CM

## 2023-11-04 DIAGNOSIS — M545 Low back pain, unspecified: Secondary | ICD-10-CM

## 2023-11-17 ENCOUNTER — Ambulatory Visit
Admission: RE | Admit: 2023-11-17 | Discharge: 2023-11-17 | Disposition: A | Discharge status: Home / Self Care | Source: Ambulatory Visit | Attending: Family Medicine | Admitting: Family Medicine

## 2023-11-17 DIAGNOSIS — M545 Low back pain, unspecified: Secondary | ICD-10-CM | POA: Diagnosis present

## 2023-11-17 DIAGNOSIS — R634 Abnormal weight loss: Secondary | ICD-10-CM | POA: Diagnosis present

## 2023-11-17 DIAGNOSIS — R29898 Other symptoms and signs involving the musculoskeletal system: Secondary | ICD-10-CM | POA: Diagnosis present

## 2023-11-17 MED ORDER — GADOBUTROL 1 MMOL/ML IV SOLN
10.0000 mL | Freq: Once | INTRAVENOUS | Status: AC | PRN
  Administered 2023-11-17: 10 mL via INTRAVENOUS

## 2023-11-23 ENCOUNTER — Encounter: Payer: Self-pay | Admitting: Emergency Medicine

## 2023-11-23 ENCOUNTER — Other Ambulatory Visit: Payer: Self-pay

## 2023-11-23 ENCOUNTER — Inpatient Hospital Stay
Admission: EM | Admit: 2023-11-23 | Discharge: 2023-11-27 | DRG: 552 | Disposition: A | Discharge status: Home Health | Attending: Emergency Medicine | Admitting: Emergency Medicine

## 2023-11-23 ENCOUNTER — Emergency Department

## 2023-11-23 DIAGNOSIS — M4647 Discitis, unspecified, lumbosacral region: Secondary | ICD-10-CM | POA: Diagnosis not present

## 2023-11-23 DIAGNOSIS — Z8419 Family history of other disorders of kidney and ureter: Secondary | ICD-10-CM

## 2023-11-23 DIAGNOSIS — M5441 Lumbago with sciatica, right side: Secondary | ICD-10-CM | POA: Diagnosis not present

## 2023-11-23 DIAGNOSIS — B954 Other streptococcus as the cause of diseases classified elsewhere: Secondary | ICD-10-CM | POA: Diagnosis present

## 2023-11-23 DIAGNOSIS — Z882 Allergy status to sulfonamides status: Secondary | ICD-10-CM

## 2023-11-23 DIAGNOSIS — B955 Unspecified streptococcus as the cause of diseases classified elsewhere: Secondary | ICD-10-CM

## 2023-11-23 DIAGNOSIS — R5383 Other fatigue: Secondary | ICD-10-CM | POA: Diagnosis present

## 2023-11-23 DIAGNOSIS — Z79899 Other long term (current) drug therapy: Secondary | ICD-10-CM

## 2023-11-23 DIAGNOSIS — R531 Weakness: Secondary | ICD-10-CM | POA: Diagnosis present

## 2023-11-23 DIAGNOSIS — E1169 Type 2 diabetes mellitus with other specified complication: Secondary | ICD-10-CM | POA: Diagnosis present

## 2023-11-23 DIAGNOSIS — G8929 Other chronic pain: Secondary | ICD-10-CM | POA: Diagnosis not present

## 2023-11-23 DIAGNOSIS — I129 Hypertensive chronic kidney disease with stage 1 through stage 4 chronic kidney disease, or unspecified chronic kidney disease: Secondary | ICD-10-CM | POA: Diagnosis present

## 2023-11-23 DIAGNOSIS — Z7984 Long term (current) use of oral hypoglycemic drugs: Secondary | ICD-10-CM

## 2023-11-23 DIAGNOSIS — M545 Low back pain, unspecified: Secondary | ICD-10-CM | POA: Diagnosis not present

## 2023-11-23 DIAGNOSIS — N1831 Chronic kidney disease, stage 3a: Secondary | ICD-10-CM | POA: Diagnosis present

## 2023-11-23 DIAGNOSIS — M4646 Discitis, unspecified, lumbar region: Secondary | ICD-10-CM

## 2023-11-23 DIAGNOSIS — M5442 Lumbago with sciatica, left side: Secondary | ICD-10-CM | POA: Diagnosis not present

## 2023-11-23 DIAGNOSIS — Z8249 Family history of ischemic heart disease and other diseases of the circulatory system: Secondary | ICD-10-CM

## 2023-11-23 DIAGNOSIS — E782 Mixed hyperlipidemia: Secondary | ICD-10-CM | POA: Diagnosis present

## 2023-11-23 DIAGNOSIS — I330 Acute and subacute infective endocarditis: Secondary | ICD-10-CM | POA: Diagnosis present

## 2023-11-23 DIAGNOSIS — Z832 Family history of diseases of the blood and blood-forming organs and certain disorders involving the immune mechanism: Secondary | ICD-10-CM

## 2023-11-23 DIAGNOSIS — M109 Gout, unspecified: Secondary | ICD-10-CM | POA: Diagnosis present

## 2023-11-23 DIAGNOSIS — M4627 Osteomyelitis of vertebra, lumbosacral region: Principal | ICD-10-CM | POA: Diagnosis present

## 2023-11-23 DIAGNOSIS — I10 Essential (primary) hypertension: Secondary | ICD-10-CM | POA: Diagnosis present

## 2023-11-23 DIAGNOSIS — Z888 Allergy status to other drugs, medicaments and biological substances status: Secondary | ICD-10-CM

## 2023-11-23 DIAGNOSIS — R202 Paresthesia of skin: Secondary | ICD-10-CM | POA: Diagnosis present

## 2023-11-23 DIAGNOSIS — Z87442 Personal history of urinary calculi: Secondary | ICD-10-CM

## 2023-11-23 DIAGNOSIS — Z7982 Long term (current) use of aspirin: Secondary | ICD-10-CM

## 2023-11-23 DIAGNOSIS — E1122 Type 2 diabetes mellitus with diabetic chronic kidney disease: Secondary | ICD-10-CM | POA: Diagnosis present

## 2023-11-23 DIAGNOSIS — E119 Type 2 diabetes mellitus without complications: Secondary | ICD-10-CM

## 2023-11-23 DIAGNOSIS — R7881 Bacteremia: Secondary | ICD-10-CM | POA: Diagnosis present

## 2023-11-23 LAB — CBC WITH DIFFERENTIAL/PLATELET
Abs Immature Granulocytes: 0.01 K/uL (ref 0.00–0.07)
Basophils Absolute: 0.1 K/uL (ref 0.0–0.1)
Basophils Relative: 1 %
Eosinophils Absolute: 0.1 K/uL (ref 0.0–0.5)
Eosinophils Relative: 2 %
HCT: 38.9 % — ABNORMAL LOW (ref 39.0–52.0)
Hemoglobin: 12.5 g/dL — ABNORMAL LOW (ref 13.0–17.0)
Immature Granulocytes: 0 %
Lymphocytes Relative: 38 %
Lymphs Abs: 2.3 K/uL (ref 0.7–4.0)
MCH: 28.9 pg (ref 26.0–34.0)
MCHC: 32.1 g/dL (ref 30.0–36.0)
MCV: 90 fL (ref 80.0–100.0)
Monocytes Absolute: 0.5 K/uL (ref 0.1–1.0)
Monocytes Relative: 8 %
Neutro Abs: 3.2 K/uL (ref 1.7–7.7)
Neutrophils Relative %: 51 %
Platelets: 299 K/uL (ref 150–400)
RBC: 4.32 MIL/uL (ref 4.22–5.81)
RDW: 12.8 % (ref 11.5–15.5)
WBC: 6.1 K/uL (ref 4.0–10.5)
nRBC: 0 % (ref 0.0–0.2)

## 2023-11-23 LAB — LACTIC ACID, PLASMA: Lactic Acid, Venous: 1.2 mmol/L (ref 0.5–1.9)

## 2023-11-23 LAB — COMPREHENSIVE METABOLIC PANEL WITH GFR
ALT: <5 U/L (ref 0–44)
AST: 14 U/L — ABNORMAL LOW (ref 15–41)
Albumin: 3.1 g/dL — ABNORMAL LOW (ref 3.5–5.0)
Alkaline Phosphatase: 63 U/L (ref 38–126)
Anion gap: 11 (ref 5–15)
BUN: 20 mg/dL (ref 8–23)
CO2: 17 mmol/L — ABNORMAL LOW (ref 22–32)
Calcium: 7.1 mg/dL — ABNORMAL LOW (ref 8.9–10.3)
Chloride: 117 mmol/L — ABNORMAL HIGH (ref 98–111)
Creatinine, Ser: 1.27 mg/dL — ABNORMAL HIGH (ref 0.61–1.24)
GFR, Estimated: 58 mL/min — ABNORMAL LOW (ref >60)
Glucose, Bld: 82 mg/dL (ref 70–99)
Potassium: 3.3 mmol/L — ABNORMAL LOW (ref 3.5–5.1)
Sodium: 144 mmol/L (ref 135–145)
Total Bilirubin: 0.3 mg/dL (ref 0.0–1.2)
Total Protein: 5.8 g/dL — ABNORMAL LOW (ref 6.5–8.1)

## 2023-11-23 LAB — C-REACTIVE PROTEIN: CRP: <0.5 mg/dL (ref <1.0)

## 2023-11-23 LAB — HEMOGLOBIN A1C
Hgb A1c MFr Bld: 6.1 % — ABNORMAL HIGH (ref 4.8–5.6)
Mean Plasma Glucose: 128.37 mg/dL

## 2023-11-23 LAB — GLUCOSE, CAPILLARY
Glucose-Capillary: 93 mg/dL (ref 70–99)
Glucose-Capillary: 84 mg/dL (ref 70–99)

## 2023-11-23 LAB — SEDIMENTATION RATE: Sed Rate: 25 mm/h — ABNORMAL HIGH (ref 0–20)

## 2023-11-23 MED ORDER — FUROSEMIDE 40 MG PO TABS
40.0000 mg | ORAL_TABLET | Freq: Every day | ORAL | Status: DC
  Administered 2023-11-23 – 2023-11-27 (×5): 40 mg via ORAL
  Filled 2023-11-23 (×6): qty 1

## 2023-11-23 MED ORDER — POLYETHYLENE GLYCOL 3350 17 G PO PACK
17.0000 g | PACK | Freq: Every day | ORAL | Status: DC | PRN
  Administered 2023-11-26: 17 g via ORAL
  Filled 2023-11-23: qty 1

## 2023-11-23 MED ORDER — EZETIMIBE 10 MG PO TABS
10.0000 mg | ORAL_TABLET | Freq: Every day | ORAL | Status: DC
  Administered 2023-11-24 – 2023-11-27 (×4): 10 mg via ORAL
  Filled 2023-11-23 (×4): qty 1

## 2023-11-23 MED ORDER — OXYCODONE HCL 5 MG PO TABS: 5.0000 mg | ORAL_TABLET | ORAL | Status: DC | PRN

## 2023-11-23 MED ORDER — ACETAMINOPHEN 650 MG RE SUPP: 650.0000 mg | Freq: Four times a day (QID) | RECTAL | Status: DC | PRN

## 2023-11-23 MED ORDER — ENOXAPARIN SODIUM 40 MG/0.4ML IJ SOSY
40.0000 mg | PREFILLED_SYRINGE | INTRAMUSCULAR | Status: DC
  Administered 2023-11-23: 40 mg via SUBCUTANEOUS
  Filled 2023-11-23: qty 0.4

## 2023-11-23 MED ORDER — INSULIN ASPART 100 UNIT/ML IJ SOLN
0.0000 [IU] | Freq: Three times a day (TID) | INTRAMUSCULAR | Status: DC
  Administered 2023-11-24: 3 [IU] via SUBCUTANEOUS
  Administered 2023-11-25 – 2023-11-26 (×3): 1 [IU] via SUBCUTANEOUS
  Administered 2023-11-26: 2 [IU] via SUBCUTANEOUS
  Filled 2023-11-23: qty 1
  Filled 2023-11-23: qty 2
  Filled 2023-11-23: qty 1
  Filled 2023-11-23: qty 2
  Filled 2023-11-23: qty 3

## 2023-11-23 MED ORDER — INSULIN ASPART 100 UNIT/ML IJ SOLN: 0.0000 [IU] | Freq: Every day | INTRAMUSCULAR | Status: DC

## 2023-11-23 MED ORDER — MORPHINE SULFATE (PF) 2 MG/ML IV SOLN: 2.0000 mg | INTRAVENOUS | Status: DC | PRN

## 2023-11-23 MED ORDER — ONDANSETRON HCL 4 MG/2ML IJ SOLN
4.0000 mg | Freq: Four times a day (QID) | INTRAMUSCULAR | Status: DC | PRN
Start: 2023-11-23 — End: 2023-11-27

## 2023-11-23 MED ORDER — ONDANSETRON HCL 4 MG PO TABS: 4.0000 mg | ORAL_TABLET | Freq: Four times a day (QID) | ORAL | Status: DC | PRN

## 2023-11-23 MED ORDER — AMLODIPINE BESYLATE 10 MG PO TABS
10.0000 mg | ORAL_TABLET | Freq: Every day | ORAL | Status: DC
  Administered 2023-11-23 – 2023-11-27 (×5): 10 mg via ORAL
  Filled 2023-11-23 (×5): qty 1

## 2023-11-23 MED ORDER — COLCHICINE 0.6 MG PO TABS
0.6000 mg | ORAL_TABLET | Freq: Every day | ORAL | Status: DC | PRN
  Filled 2023-11-23: qty 1

## 2023-11-23 MED ORDER — ACETAMINOPHEN 325 MG PO TABS
650.0000 mg | ORAL_TABLET | Freq: Four times a day (QID) | ORAL | Status: DC | PRN
  Administered 2023-11-24: 650 mg via ORAL
  Filled 2023-11-23 (×2): qty 2

## 2023-11-23 NOTE — H&P (Signed)
 History and Physical    Garrett Frank FMW:[id number] DOB: 09-06-1945 DOA: 11/23/2023  DOS: the patient was seen and examined on 11/23/2023  PCP: [place] HERO, MD   Patient coming from: Home  I have personally briefly reviewed patient's old medical records in [facility]  Chief Complaint: Low back pain  HPI: Garrett Frank is a pleasant 78 y.o. male with medical history significant for DM, HTN, gout who was sent over by PCP after he was found to have a MRI of the back indicating possible lumbar spine L5-S1 discitis versus abscess for evaluation.  Patient has intermittent back pain for the last 6 months which is worsened by physical activity and improved with rest.  Patient stated that pain is 6-7/10 in intensity gets worse when he moves around, does not radiate.  He denies any history of fall, trauma, fever, chills, nausea, vomiting, abdominal pain.  ED Course: Upon arrival to the ED, patient is found to be reassuring workup including normal white count, his ESR was 25 but patient has mild L5 tenderness.  Blood cultures were sent including fungal, C-reactive protein and ESR was ordered.  IR was consulted for possible biopsy of the targeted vertebra.  Infectious disease was contacted for assistance with the treatment.  Hospitalist service was consulted for evaluation for admission.  Review of Systems:  ROS  All other systems negative except as noted in the HPI.  Past Medical History:  Diagnosis Date   Diabetes mellitus    Gout    HTN (hypertension)    Morbid obesity (HCC)    Nephrolithiasis     Past Surgical History:  Procedure Laterality Date   CHOLECYSTECTOMY       reports that he has quit smoking. His smoking use included cigarettes. He has a 7.5 pack-year smoking history. He has never used smokeless tobacco. He reports current alcohol use. He reports that he does not use drugs.  Allergies  Allergen Reactions   Crestor  [Rosuvastatin  Calcium ] Other (See Comments)     Myalgias   Elemental Sulfur Hives   Statins     Does not work well   Sulfonamide Derivatives     Family History  Problem Relation Age of Onset   Aneurysm Mother 11   Hypertension Father    Kidney failure Father    Coronary artery disease Neg Hx     Prior to Admission medications   Medication Sig Start Date End Date Taking? Authorizing Provider  amLODipine  (NORVASC ) 10 MG tablet Take 1 tablet (10 mg total) by mouth daily. 05/06/20   Gollan, Timothy J, MD  aspirin 81 MG EC tablet Take 81 mg by mouth daily.    [provider]  colchicine 0.6 MG tablet Take 0.6 mg by mouth daily. Take 2 tablets by mouth on 1st onset of GOUT flare, follow in 1 hour with 1 tablet (maximum 3 tablets in a hour)    [provider]  docusate sodium (COLACE) 100 MG capsule Take 100 mg by mouth daily as needed for mild constipation.    [provider]  dorzolamide-timolol (COSOPT) 22.3-6.8 MG/ML ophthalmic solution Place 1 drop into both eyes 2 (two) times daily. 11/03/13   [provider]  ezetimibe  (ZETIA ) 10 MG tablet TAKE 1 TABLET BY MOUTH EVERY DAY 02/19/20   Gollan, Timothy J, MD  furosemide  (LASIX ) 40 MG tablet 1 tablet daily. 05/15/13   Dann Candyce RAMAN, MD  glipiZIDE (GLUCOTROL XL) 10 MG 24 hr tablet Take 10 mg by  mouth daily with breakfast. 06/08/19   [provider]  hydrALAZINE  (APRESOLINE ) 100 MG tablet Take 1 tablet (100 mg total) by mouth 3 (three) times daily. 03/11/18 07/10/19  Abigail Bernardino HERO, PA-C  latanoprost (XALATAN) 0.005 % ophthalmic solution Place 1 drop into both eyes at bedtime. 07/11/15   [provider]  linagliptin (TRADJENTA) 5 MG TABS tablet Take 5 mg by mouth daily.    [provider]  losartan (COZAAR) 100 MG tablet Take 1 tablet by mouth daily. 11/01/13   [provider]  ONE TOUCH ULTRA TEST test strip CHECK BLOOD SUGAR TWICE A DAY AS DIRECTED DX CODE: 250.02 07/26/14   [provider]    Physical  Exam: Vitals:   11/23/23 1017 11/23/23 1018 11/23/23 1505  BP: (!) (P) 155/90  (!) 150/88  Pulse: (P) 74  70  Resp: (P) 16  18  Temp: (P) 97.8 F (36.6 C)  98 F (36.7 C)  TempSrc: (P) Oral    SpO2: (P) 100%  98%  Weight:  99.8 kg   Height:  6' 1 (1.854 m)     Physical Exam   Constitutional: Alert, awake, calm, comfortable HEENT: Neck supple Respiratory: Clear to auscultation B/L, no wheezing, no rales.  Cardiovascular: Regular rate and rhythm, no murmurs / rubs / gallops. No extremity edema. 2+ pedal pulses. No carotid bruits.  Abdomen: Soft, no tenderness, Bowel sounds positive.  Musculoskeletal: no clubbing / cyanosis. Good ROM, no contractures. Normal muscle tone.  Mild L5 tenderness present.  No outside injuries or lesion noted. Skin: no rashes, lesions, ulcers. Neurologic: CN 2-12 grossly intact. Sensation intact, No focal deficit identified Psychiatric: Alert and oriented x 3. Normal mood.    Labs on Admission: I have personally reviewed following labs and imaging studies  CBC: Recent Labs  Lab 11/23/23 1116  WBC 6.1  NEUTROABS 3.2  HGB 12.5*  HCT 38.9*  MCV 90.0  PLT 299   Basic Metabolic Panel: Recent Labs  Lab 11/23/23 1116  NA 144  K 3.3*  CL 117*  CO2 17*  GLUCOSE 82  BUN 20  CREATININE 1.27*  CALCIUM  7.1*   GFR: Estimated Creatinine Clearance: 59.6 mL/min (A) (by C-G formula based on SCr of 1.27 mg/dL (H)). Liver Function Tests: Recent Labs  Lab 11/23/23 1116  AST 14*  ALT <5  ALKPHOS 63  BILITOT 0.3  PROT 5.8*  ALBUMIN 3.1*   No results for input(s): LIPASE, AMYLASE in the last 168 hours. No results for input(s): AMMONIA in the last 168 hours. Coagulation Profile: No results for input(s): INR, PROTIME in the last 168 hours. Cardiac Enzymes: No results for input(s): CKTOTAL, CKMB, CKMBINDEX, TROPONINI, TROPONINIHS in the last 168 hours. BNP (last 3 results) No results for input(s): BNP in the last 8760  hours. HbA1C: No results for input(s): HGBA1C in the last 72 hours. CBG: No results for input(s): GLUCAP in the last 168 hours. Lipid Profile: No results for input(s): CHOL, HDL, LDLCALC, TRIG, CHOLHDL, LDLDIRECT in the last 72 hours. Thyroid  Function Tests: No results for input(s): TSH, T4TOTAL, FREET4, T3FREE, THYROIDAB in the last 72 hours. Anemia Panel: No results for input(s): VITAMINB12, FOLATE, FERRITIN, TIBC, IRON, RETICCTPCT in the last 72 hours. Urine analysis: No results found for: COLORURINE, APPEARANCEUR, LABSPEC, PHURINE, GLUCOSEU, HGBUR, BILIRUBINUR, KETONESUR, PROTEINUR, UROBILINOGEN, NITRITE, LEUKOCYTESUR  Radiological Exams on Admission: I have personally reviewed images No results found.  EKG: N/A    Assessment/Plan Principal Problem:   Low back pain Active  Problems:   Mixed hyperlipidemia   Essential hypertension   Controlled type 2 diabetes mellitus without complication, without long-term current use of insulin (HCC)    Assessment and Plan: 78 year old male with history of diabetes, HTN, HLD, gout presented to ED after he was found to have abnormal MRI sent over by primary care physician concerning for discitis/osteomyelitis of L5-S1 vertebra.  1.  L5-S1 discitis versus osteomyelitis - He will be placed in observation. - Infectious disease and IR has been consulted. - Patient is being planned for IR guided biopsy of the suspected lesion. - Infectious disease advised no antibiotics at this point until we have a biopsy result. - Hold off aspirin in anticipation for spinal biopsy - Will appreciate consult from ID and IR. - Will give pain medications and make him comfortable  2.  HTN/HLD - Resume home medications including amlodipine  and Zetia   3.  Diabetes - Will hold off oral hypoglycemic medications - Place him on insulin sliding scale  4.  Gout - Resume home dose of colchicine.  5.   Ankle edema - Patient is taking Lasix  40 mg daily which will be resumed.    DVT prophylaxis: Lovenox Code Status: Full Code Family Communication: Daughter was at bedside Disposition Plan: Home Consults called: IR, ID Admission status: Observation, Med-Surg   Nena Rebel, MD [facility] 11/23/2023, 4:32 PM

## 2023-11-23 NOTE — ED Provider Notes (Signed)
 [facility] Provider Note    Event Date/Time   First MD Initiated Contact with Patient 11/23/23 1024     (approximate)   History   Back Pain   HPI  Garrett Frank is a 78 y.o. male presents to the ED as instructed by his PCP to help x-rays done of his back.  Patient states that the nurse told him to come to the emergency department for this to be ordered.  He is uncertain as to why he is here as he had an MRI of his back 6 days ago.  He has not yet received the results of that MRI.  Patient has history of intermittent back pain for the last 6 months which is worsened by physical activity and improved with rest.  Patient denies any incontinence of bowel or bladder or saddle anesthesias.  He reports today he has no back pain.  Patient has history of hypertension, diabetes, gout, CKD and nephrolithiasis.     Physical Exam   Triage Vital Signs: ED Triage Vitals  Encounter Vitals Group     BP 11/23/23 1017 (!) (P) 155/90     Girls Systolic BP Percentile --      Girls Diastolic BP Percentile --      Boys Systolic BP Percentile --      Boys Diastolic BP Percentile --      Pulse Rate 11/23/23 1017 (P) 74     Resp 11/23/23 1017 (P) 16     Temp 11/23/23 1017 (P) 97.8 F (36.6 C)     Temp Source 11/23/23 1017 (P) Oral     SpO2 11/23/23 1017 (P) 100 %     Weight 11/23/23 1018 220 lb (99.8 kg)     Height 11/23/23 1018 6' 1 (1.854 m)     Head Circumference --      Peak Flow --      Pain Score 11/23/23 1018 0     Pain Loc --      Pain Education --      Exclude from Growth Chart --     Most recent vital signs: Vitals:   11/23/23 1017 11/23/23 1505  BP: (!) (P) 155/90 (!) 150/88  Pulse: (P) 74 70  Resp: (P) 16 18  Temp: (P) 97.8 F (36.6 C) 98 F (36.7 C)  SpO2: (P) 100% 98%     General: Awake, no distress.  Alert, talkative, answers questions appropriately and is ambulatory without any assistance. CV:  Good peripheral perfusion.  Heart regular rate  rhythm. Resp:  Normal effort.  Lungs clear bilaterally. Abd:  No distention.  Other:  On examination of the back there is no point tenderness on palpation of the thoracic or lumbar spine.  No tenderness on palpation of the paravertebral muscles bilaterally.  Patient is able to stand and ambulate without any assistance.  Good muscle strength bilaterally.   ED Results / Procedures / Treatments   Labs (all labs ordered are listed, but only abnormal results are displayed) Labs Reviewed  CBC WITH DIFFERENTIAL/PLATELET - Abnormal; Notable for the following components:      Result Value   Hemoglobin 12.5 (*)    HCT 38.9 (*)    All other components within normal limits  COMPREHENSIVE METABOLIC PANEL WITH GFR - Abnormal; Notable for the following components:   Potassium 3.3 (*)    Chloride 117 (*)    CO2 17 (*)    Creatinine, Ser 1.27 (*)    Calcium   7.1 (*)    Total Protein 5.8 (*)    Albumin 3.1 (*)    AST 14 (*)    GFR, Estimated 58 (*)    All other components within normal limits  SEDIMENTATION RATE - Abnormal; Notable for the following components:   Sed Rate 25 (*)    All other components within normal limits  CULTURE, BLOOD (ROUTINE X 2)  CULTURE, BLOOD (ROUTINE X 2)  FUNGUS CULTURE, BLOOD  LACTIC ACID, PLASMA  C-REACTIVE PROTEIN    RADIOLOGY Reviewed MRI from 11/17/2023.    PROCEDURES:  Critical Care performed:   Procedures   MEDICATIONS ORDERED IN ED: Medications - No data to display   IMPRESSION / MDM / ASSESSMENT AND PLAN / ED COURSE  I reviewed the triage vital signs and the nursing notes.   Differential diagnosis includes, but is not limited to, intermittent chronic back pain, musculoskeletal pain, abscess, discitis, compression fracture considered, cauda equina considered,  78 year old male presents to the ED with complaint of intermittent low back pain for approximately 6 months without history of injury.  Patient had an MRI of his lumbar spine done and  was told come to the emergency department for further evaluation by his PCP.  Patient denies any back pain today.  CBC with a nonelevated white count, potassium slightly low at 3.3, BUN and creatinine are essentially in keeping with his chronic kidney disease.  Lactic acid is 1.2, sed rate 25.  I consulted with Dr. Penne Sharps who is on-call for neurosurgery who looked at the MRI and believes this appears more of a discitis than an abscess.  I also consulted Dr. Searcy who is on-call for infectious disease.  She is requesting a aspiration of this area before starting antibiotics.  Blood cultures and fungal cultures were obtained.  Patient was made aware that he will be staying for a procedure tomorrow to further investigate this area.  Arrangements are currently being made to consult the interventional radiologist for procedure tomorrow.  ----------------------------------------- 4:01 PM on 11/23/2023 ----------------------------------------- Waiting on IR for further instructions.  Hospitalist is aware that patient is in the ED and was also copied in on conversation with Dr. Searcy.  Dr. Dorothyann is aware.      Patient's presentation is most consistent with acute complicated illness / injury requiring diagnostic workup.  FINAL CLINICAL IMPRESSION(S) / ED DIAGNOSES   Final diagnoses:  Lumbar pain     Rx / DC Orders   ED Discharge Orders     None        Note:  This document was prepared using Dragon voice recognition software and may include unintentional dictation errors.   Saunders Shona CROME, PA-C 11/23/23 1603    Floy Roberts, MD 11/24/23 [phone]

## 2023-11-23 NOTE — ED Notes (Signed)
 See triage note  Presents with lower back pain  States pain moves into both legs intermittently  Ambulates well

## 2023-11-23 NOTE — Consult Note (Signed)
 NAME: Garrett Frank  DOB: 08-Aug-1945  MRN: [id number]  Date/Time: 11/23/2023 10:54 PM  REQUESTING PROVIDER: Saunders PA Subjective:  REASON FOR CONSULT: Lumbar discitis ? Garrett Frank is a 78 y.o. male with a history of hypertension, diabetes mellitus followed at [facility] clinic presents with abnormal MRI As per patient he has been having back pain for the past 3 months He says the beginning of summer he was mowing his lawn and then felt weakness on his left leg followed by right leg and hence he came in rested.  And the rest and the weakness had resolved But the back pain was persistent He went to his PCP at [facility] clinic and she got an MRI for him on 11/17/2023 it was reported as abnormal L5-S1 level most compatible with infectious discitis osteomyelitis.  Patient was referred to the ED He is afebrile His functional capabilities are stable In the ED vitals BP 150/88, temperature 98, pulse 70, respiratory rate 18 and sats of 98% WBC 6.1, Hb 12.5, platelet 299 and creatinine 1.27. I am asked to see the patient for the same He does not give history of urinary incontinence or bowel incontinence Some pain in his thighs No falls  Past Medical History:  Diagnosis Date   Diabetes mellitus    Gout    HTN (hypertension)    Morbid obesity (HCC)    Nephrolithiasis     Past Surgical History:  Procedure Laterality Date   CHOLECYSTECTOMY      Social History   Socioeconomic History   Marital status: Widowed    Spouse name: Not on file   Number of children: Not on file   Years of education: Not on file   Highest education level: Not on file  Occupational History   Not on file  Tobacco Use   Smoking status: Former    Current packs/day: 0.50    Average packs/day: 0.5 packs/day for 15.0 years (7.5 ttl pk-yrs)    Types: Cigarettes   Smokeless tobacco: Never  Vaping Use   Vaping status: Never Used  Substance and Sexual Activity   Alcohol use: Yes    Comment: ocassionally   Drug use: No    Sexual activity: Not on file  Other Topics Concern   Not on file  Social History Narrative   Not on file   Social Drivers of Health   Financial Resource Strain: Not on file  Food Insecurity: No Food Insecurity (11/23/2023)   Hunger Vital Sign    Worried About Running Out of Food in the Last Year: Never true    Ran Out of Food in the Last Year: Never true  Transportation Needs: No Transportation Needs (11/23/2023)   PRAPARE - Administrator, Civil Service (Medical): No    Lack of Transportation (Non-Medical): No  Physical Activity: Not on file  Stress: Not on file  Social Connections: Moderately Integrated (11/23/2023)   Social Connection and Isolation Panel    Frequency of Communication with Friends and Family: More than three times a week    Frequency of Social Gatherings with Friends and Family: More than three times a week    Attends Religious Services: 1 to 4 times per year    Active Member of [employer] or Organizations: No    Attends Engineer, Structural: More than 4 times per year    Marital Status: Separated  Intimate Partner Violence: Not At Risk (11/23/2023)   Humiliation, Afraid, Rape, and Kick questionnaire  Fear of Current or Ex-Partner: No    Emotionally Abused: No    Physically Abused: No    Sexually Abused: No    Family History  Problem Relation Age of Onset   Aneurysm Mother 25   Hypertension Father    Kidney failure Father    Coronary artery disease Neg Hx    Allergies  Allergen Reactions   Crestor  [Rosuvastatin  Calcium ] Other (See Comments)    Myalgias   Elemental Sulfur Hives   Statins     Does not work well   Sulfonamide Derivatives    I? Current Facility-Administered Medications  Medication Dose Route Frequency Provider Last Rate Last Admin   acetaminophen (TYLENOL) tablet 650 mg  650 mg Oral Q6H PRN Paudel, Keshab, MD       Or   acetaminophen (TYLENOL) suppository 650 mg  650 mg Rectal Q6H PRN Paudel, Keshab, MD        amLODipine  (NORVASC ) tablet 10 mg  10 mg Oral Daily Paudel, Nena, MD   10 mg at 11/23/23 1829   colchicine tablet 0.6 mg  0.6 mg Oral Daily PRN Paudel, Keshab, MD       enoxaparin (LOVENOX) injection 40 mg  40 mg Subcutaneous Q24H Paudel, Keshab, MD   40 mg at 11/23/23 2146   [START ON 11/24/2023] ezetimibe  (ZETIA ) tablet 10 mg  10 mg Oral Daily Paudel, Nena, MD       furosemide  (LASIX ) tablet 40 mg  40 mg Oral Daily Paudel, Nena, MD   40 mg at 11/23/23 1829   insulin aspart (novoLOG) injection 0-5 Units  0-5 Units Subcutaneous QHS Paudel, Nena, MD       insulin aspart (novoLOG) injection 0-9 Units  0-9 Units Subcutaneous TID WC Paudel, Nena, MD       morphine (PF) 2 MG/ML injection 2 mg  2 mg Intravenous Q4H PRN Paudel, Keshab, MD       ondansetron (ZOFRAN) tablet 4 mg  4 mg Oral Q6H PRN Paudel, Keshab, MD       Or   ondansetron (ZOFRAN) injection 4 mg  4 mg Intravenous Q6H PRN Paudel, Nena, MD       oxyCODONE (Oxy IR/ROXICODONE) immediate release tablet 5 mg  5 mg Oral Q4H PRN Paudel, Nena, MD       polyethylene glycol (MIRALAX / GLYCOLAX) packet 17 g  17 g Oral Daily PRN Roann Nena, MD         Abtx:  Anti-infectives (From admission, onward)    None       REVIEW OF SYSTEMS:  Const: negative fever, negative chills, negative weight loss Eyes: negative diplopia or visual changes, negative eye pain ENT: negative coryza, negative sore throat Resp: negative cough, hemoptysis, dyspnea Cards: negative for chest pain, palpitations, lower extremity edema GU: negative for frequency, dysuria and hematuria GI: Negative for abdominal pain, diarrhea, bleeding, constipation Skin: negative for rash and pruritus Heme: negative for easy bruising and gum/nose bleeding MS: Low back pain, Neurolo:negative for headaches, dizziness, vertigo, memory problems  Psych: negative for feelings of anxiety, depression  Endocrine:  diabetes Allergy/Immunology- negative for any medication or  food allergies ? Pertinent Positives include : Objective:  VITALS:  BP 125/69 (BP Location: Left Arm)   Pulse (!) 55   Temp 98.3 F (36.8 C) (Oral)   Resp 18   Ht 6' 1 (1.854 m)   Wt 99.8 kg   SpO2 100%   BMI 29.03 kg/m   PHYSICAL EXAM:  General: Alert, cooperative,  no distress, appears stated age.  Head: Normocephalic, without obvious abnormality, atraumatic. Eyes: Conjunctivae clear, anicteric sclerae. Pupils are equal ENT Nares normal. No drainage or sinus tenderness. Lips, mucosa, and tongue normal. No Thrush Neck: Supple, symmetrical, no adenopathy, thyroid : non tender no carotid bruit and no JVD. Back: No CVA tenderness. Lungs: Clear to auscultation bilaterally. No Wheezing or Rhonchi. No rales. Heart: Regular rate and rhythm, no murmur, rub or gallop. Abdomen: Soft, non-tender,not distended. Bowel sounds normal. No masses Extremities: atraumatic, no cyanosis. No edema. No clubbing Skin: No rashes or lesions. Or bruising Lymph: Cervical, supraclavicular normal. Neurologic: Grossly non-focal Pertinent Labs Lab Results CBC    Component Value Date/Time   WBC 6.1 11/23/2023 1116   RBC 4.32 11/23/2023 1116   HGB 12.5 (L) 11/23/2023 1116   HCT 38.9 (L) 11/23/2023 1116   PLT 299 11/23/2023 1116   MCV 90.0 11/23/2023 1116   MCH 28.9 11/23/2023 1116   MCHC 32.1 11/23/2023 1116   RDW 12.8 11/23/2023 1116   LYMPHSABS 2.3 11/23/2023 1116   MONOABS 0.5 11/23/2023 1116   EOSABS 0.1 11/23/2023 1116   BASOSABS 0.1 11/23/2023 1116       Latest Ref Rng & Units 11/23/2023   11:16 AM 07/10/2019    4:54 PM 03/11/2018    2:58 PM  CMP  Glucose 70 - 99 mg/dL 82  896  899   BUN 8 - 23 mg/dL 20  21  20    Creatinine 0.61 - 1.24 mg/dL 8.72  8.49  8.51   Sodium 135 - 145 mmol/L 144  138  138   Potassium 3.5 - 5.1 mmol/L 3.3  4.0  4.0   Chloride 98 - 111 mmol/L 117  104  104   CO2 22 - 32 mmol/L 17  25  22    Calcium  8.9 - 10.3 mg/dL 7.1  8.9  8.9   Total Protein 6.5 - 8.1  g/dL 5.8  7.9  6.9   Total Bilirubin 0.0 - 1.2 mg/dL 0.3  0.5  0.2   Alkaline Phos 38 - 126 U/L 63  75  76   AST 15 - 41 U/L 14  18  14    ALT 0 - 44 U/L 5  16  11        Microbiology: Blood culture sent on 11/23/2023  IMAGING RESULTS: MRI done on 11/17/2023 shows L5-S1 discitis and vertebral osteomyelitis I have personally reviewed the films ? Impression/Recommendation Low back pain for the past few months MRI shows L5-S1 discitis/osteomyelitis Patient awaiting disc aspiration No need to start antibiotics before the aspiration Blood cultures have been sent Sed rate is 25   Diabetes mellitus management as per primary team  Hypertension management as per primary team  CKD  ? I have personally spent  -60--minutes involved in face-to-face and non-face-to-face activities for this patient on the day of the visit. Professional time spent includes the following activities: Preparing to see the patient (review of tests), Obtaining and/or reviewing separately obtained history (admission/discharge record), Performing a medically appropriate examination and/or evaluation , Ordering medications/tests/procedures, referring and communicating with other health care professionals, Documenting clinical information in the EMR, Independently interpreting results (not separately reported), Communicating results to the patient/ Counseling and educating the patient/ and Care coordination (not separately reported).    ________________________________________________  Note:  This document was prepared using Conservation officer, historic buildings and may include unintentional dictation errors.

## 2023-11-23 NOTE — ED Notes (Signed)
 Family remains at bedside  Pt has been up to bathroom  Ambulates slowly

## 2023-11-23 NOTE — ED Triage Notes (Signed)
 C/o lower back painfor over a year. [facility] referred patient to ED for xrays.  AAOx3. Skin warm and dry. NAD

## 2023-11-24 ENCOUNTER — Observation Stay

## 2023-11-24 DIAGNOSIS — Z8249 Family history of ischemic heart disease and other diseases of the circulatory system: Secondary | ICD-10-CM | POA: Diagnosis not present

## 2023-11-24 DIAGNOSIS — R5383 Other fatigue: Secondary | ICD-10-CM | POA: Diagnosis present

## 2023-11-24 DIAGNOSIS — R531 Weakness: Secondary | ICD-10-CM | POA: Diagnosis present

## 2023-11-24 DIAGNOSIS — Z7984 Long term (current) use of oral hypoglycemic drugs: Secondary | ICD-10-CM | POA: Diagnosis not present

## 2023-11-24 DIAGNOSIS — Z87442 Personal history of urinary calculi: Secondary | ICD-10-CM | POA: Diagnosis not present

## 2023-11-24 DIAGNOSIS — E119 Type 2 diabetes mellitus without complications: Secondary | ICD-10-CM

## 2023-11-24 DIAGNOSIS — E782 Mixed hyperlipidemia: Secondary | ICD-10-CM

## 2023-11-24 DIAGNOSIS — M545 Low back pain, unspecified: Secondary | ICD-10-CM | POA: Diagnosis present

## 2023-11-24 DIAGNOSIS — R202 Paresthesia of skin: Secondary | ICD-10-CM | POA: Diagnosis present

## 2023-11-24 DIAGNOSIS — Z882 Allergy status to sulfonamides status: Secondary | ICD-10-CM | POA: Diagnosis not present

## 2023-11-24 DIAGNOSIS — N1831 Chronic kidney disease, stage 3a: Secondary | ICD-10-CM | POA: Diagnosis present

## 2023-11-24 DIAGNOSIS — Z832 Family history of diseases of the blood and blood-forming organs and certain disorders involving the immune mechanism: Secondary | ICD-10-CM | POA: Diagnosis not present

## 2023-11-24 DIAGNOSIS — B954 Other streptococcus as the cause of diseases classified elsewhere: Secondary | ICD-10-CM | POA: Diagnosis present

## 2023-11-24 DIAGNOSIS — Z8419 Family history of other disorders of kidney and ureter: Secondary | ICD-10-CM | POA: Diagnosis not present

## 2023-11-24 DIAGNOSIS — M4627 Osteomyelitis of vertebra, lumbosacral region: Secondary | ICD-10-CM | POA: Diagnosis present

## 2023-11-24 DIAGNOSIS — M4647 Discitis, unspecified, lumbosacral region: Secondary | ICD-10-CM | POA: Diagnosis present

## 2023-11-24 DIAGNOSIS — I129 Hypertensive chronic kidney disease with stage 1 through stage 4 chronic kidney disease, or unspecified chronic kidney disease: Secondary | ICD-10-CM | POA: Diagnosis present

## 2023-11-24 DIAGNOSIS — Z79899 Other long term (current) drug therapy: Secondary | ICD-10-CM | POA: Diagnosis not present

## 2023-11-24 DIAGNOSIS — E1169 Type 2 diabetes mellitus with other specified complication: Secondary | ICD-10-CM | POA: Diagnosis present

## 2023-11-24 DIAGNOSIS — Z7982 Long term (current) use of aspirin: Secondary | ICD-10-CM | POA: Diagnosis not present

## 2023-11-24 DIAGNOSIS — I10 Essential (primary) hypertension: Secondary | ICD-10-CM

## 2023-11-24 DIAGNOSIS — R7881 Bacteremia: Secondary | ICD-10-CM | POA: Diagnosis present

## 2023-11-24 DIAGNOSIS — E1122 Type 2 diabetes mellitus with diabetic chronic kidney disease: Secondary | ICD-10-CM | POA: Diagnosis present

## 2023-11-24 DIAGNOSIS — Z888 Allergy status to other drugs, medicaments and biological substances status: Secondary | ICD-10-CM | POA: Diagnosis not present

## 2023-11-24 DIAGNOSIS — M109 Gout, unspecified: Secondary | ICD-10-CM | POA: Diagnosis present

## 2023-11-24 LAB — FUNGUS CULTURE, BLOOD

## 2023-11-24 LAB — CBC
HCT: 34.8 % — ABNORMAL LOW (ref 39.0–52.0)
Hemoglobin: 11.3 g/dL — ABNORMAL LOW (ref 13.0–17.0)
MCH: 29.1 pg (ref 26.0–34.0)
MCHC: 32.5 g/dL (ref 30.0–36.0)
MCV: 89.7 fL (ref 80.0–100.0)
Platelets: 267 K/uL (ref 150–400)
RBC: 3.88 MIL/uL — ABNORMAL LOW (ref 4.22–5.81)
RDW: 13 % (ref 11.5–15.5)
WBC: 5.4 K/uL (ref 4.0–10.5)
nRBC: 0 % (ref 0.0–0.2)

## 2023-11-24 LAB — BLOOD CULTURE ID PANEL (REFLEXED) - BCID2

## 2023-11-24 LAB — GLUCOSE, CAPILLARY
Glucose-Capillary: 86 mg/dL (ref 70–99)
Glucose-Capillary: 77 mg/dL (ref 70–99)
Glucose-Capillary: 88 mg/dL (ref 70–99)
Glucose-Capillary: 211 mg/dL — ABNORMAL HIGH (ref 70–99)
Glucose-Capillary: 218 mg/dL — ABNORMAL HIGH (ref 70–99)
Glucose-Capillary: 123 mg/dL — ABNORMAL HIGH (ref 70–99)

## 2023-11-24 LAB — BASIC METABOLIC PANEL WITH GFR
Anion gap: 9 (ref 5–15)
BUN: 25 mg/dL — ABNORMAL HIGH (ref 8–23)
CO2: 26 mmol/L (ref 22–32)
Calcium: 8.8 mg/dL — ABNORMAL LOW (ref 8.9–10.3)
Chloride: 103 mmol/L (ref 98–111)
Creatinine, Ser: 1.63 mg/dL — ABNORMAL HIGH (ref 0.61–1.24)
GFR, Estimated: 43 mL/min — ABNORMAL LOW (ref >60)
Glucose, Bld: 83 mg/dL (ref 70–99)
Potassium: 4.1 mmol/L (ref 3.5–5.1)
Sodium: 138 mmol/L (ref 135–145)

## 2023-11-24 LAB — PROTIME-INR
INR: 1 (ref 0.8–1.2)
Prothrombin Time: 13.5 s (ref 11.4–15.2)

## 2023-11-24 MED ORDER — FENTANYL CITRATE (PF) 100 MCG/2ML IJ SOLN
INTRAMUSCULAR | Status: AC | PRN
  Administered 2023-11-24: 50 ug via INTRAVENOUS
  Administered 2023-11-24: 25 ug via INTRAVENOUS

## 2023-11-24 MED ORDER — MIDAZOLAM HCL 2 MG/2ML IJ SOLN
INTRAMUSCULAR | Status: AC
  Filled 2023-11-24: qty 4

## 2023-11-24 MED ORDER — ENOXAPARIN SODIUM 40 MG/0.4ML IJ SOSY
40.0000 mg | PREFILLED_SYRINGE | INTRAMUSCULAR | Status: DC
Start: 2023-11-24 — End: 2023-11-27
  Administered 2023-11-24 – 2023-11-26 (×3): 40 mg via SUBCUTANEOUS
  Filled 2023-11-24 (×3): qty 0.4

## 2023-11-24 MED ORDER — SODIUM CHLORIDE 0.9 % IV SOLN
2.0000 g | INTRAVENOUS | Status: DC
  Administered 2023-11-24 – 2023-11-27 (×4): 2 g via INTRAVENOUS
  Filled 2023-11-24 (×4): qty 20

## 2023-11-24 MED ORDER — FENTANYL CITRATE (PF) 100 MCG/2ML IJ SOLN
INTRAMUSCULAR | Status: AC
  Filled 2023-11-24: qty 4

## 2023-11-24 MED ORDER — MIDAZOLAM HCL (PF) 2 MG/2ML IJ SOLN
INTRAMUSCULAR | Status: AC | PRN
  Administered 2023-11-24: .5 mg via INTRAVENOUS
  Administered 2023-11-24: 1 mg via INTRAVENOUS

## 2023-11-24 NOTE — Plan of Care (Signed)

## 2023-11-24 NOTE — Procedures (Signed)
 Pre procedural Dx: L5-S1 discitis  Post procedural Dx: Same  Technically successful CT guided aspiration of the L5-S1 disc   EBL: None.   Complications: None immediate.   KANDICE Banner, MD Pager #: [phone]

## 2023-11-24 NOTE — Care Management Obs Status (Signed)
 MEDICARE OBSERVATION STATUS NOTIFICATION   Patient Details  Name: Garrett Frank MRN: [id number] Date of Birth: 04-04-1945   Medicare Observation Status Notification Given:  Chaney BRANDY CHRISTIANE LELON, CMA 11/24/2023, 12:09 PM

## 2023-11-24 NOTE — Progress Notes (Signed)
 [facility]  - [place] [facility]   PATIENT NAME: Garrett Frank    MR#:  [id number]  DATE OF BIRTH:  1945-02-28  SUBJECTIVE:  seen earlier. No family at bedside. Patient came in with back pain. No fever. Was going to get his CT guided drainage off lower lumbar vertebrae.    VITALS:  Blood pressure 128/78, pulse 63, temperature 98.1 F (36.7 C), resp. rate 20, height 6' 1 (1.854 m), weight 99.8 kg, SpO2 100%.  PHYSICAL EXAMINATION:   GENERAL:  78 y.o.-year-old patient with no acute distress.  LUNGS: Normal breath sounds bilaterally, no wheezing CARDIOVASCULAR: S1, S2 normal. No murmur   ABDOMEN: Soft, nontender, nondistended. Bowel sounds present.  EXTREMITIES: No  edema b/l.    NEUROLOGIC: nonfocal  patient is alert and awake SKIN: No obvious rash, lesion, or ulcer.   LABORATORY PANEL:  CBC Recent Labs  Lab 11/24/23 0449  WBC 5.4  HGB 11.3*  HCT 34.8*  PLT 267    Chemistries  Recent Labs  Lab 11/23/23 1116 11/24/23 0449  NA 144 138  K 3.3* 4.1  CL 117* 103  CO2 17* 26  GLUCOSE 82 83  BUN 20 25*  CREATININE 1.27* 1.63*  CALCIUM  7.1* 8.8*  AST 14*  --   ALT <5  --   ALKPHOS 63  --   BILITOT 0.3  --     Assessment and Plan From H and P  Garrett Frank is a pleasant 78 y.o. male with medical history significant for DM, HTN, gout who was sent over by PCP after he was found to have a MRI of the back indicating possible lumbar spine L5-S1 discitis versus abscess for evaluation.  Patient has intermittent back pain for the last 6 months which is worsened by physical activity and improved with rest.  Patient stated that pain is 6-7/10 in intensity gets worse when he moves around, does not radiate.  He denies any history of fall, trauma, fever, chills, nausea, vomiting, abdominal pain.   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 - s/p IR CT guided drain L5-S1--await  culture results -started on Rocephin - prn pain meds    HTN/HLD - Resume home medications including amlodipine  and Zetia     Diabetes-2, well controlled - Will hold off oral hypoglycemic medications -  on insulin sliding scale    Gout - Resume home dose of colchicine.     Ankle edema - Patient is taking Lasix  40 mg daily which will be resumed.  PT/OT to see pt  Procedures: Family communication none Consults :ID, IR CODE STATUS: full DVT Prophylaxis :enoxaparin Level of care: Med-Surg Status is: Inpatient Remains inpatient appropriate because: rx of discitis and bacterimia    TOTAL TIME TAKING CARE OF THIS PATIENT: 40 minutes.  >50% time spent on counselling and coordination of care  Note: This dictation was prepared with Dragon dictation along with smaller phrase technology. Any transcriptional errors that result from this process are unintentional.  Leita Blanch M.D    [facility]   CC: Primary care physician; [place] HERO, MD

## 2023-11-24 NOTE — Consult Note (Signed)
 Chief Complaint:  L5-S1 Discitis  Procedure: L5-S1 Disc Aspiration  Referring Provider(s): Dr. Franky Moores  Supervising Physician: Jenna Hacker  Patient Status: [facility] - In-pt  History of Present Illness: Garrett Frank is a 78 y.o. male with a history of DM, CKD, and HTN who presented to the ED on 11/11 at the referral of his PCP after an outpatient MRI revealed findings concerning for possible discitis/osteomyelitis at L5-S1. Patient reports worsening back pain for the past several months with intermittent falls secondary to his legs giving out. ED workup was unremarkable; patient remains stable. IR consulted for possible disc aspiration which was approved by Dr. ONEIDA Frank and patient subsequently admitted to the hospital.   He is resting in bed in no acute distress. Reports an intermittent tingling sensation in his lower back, but denies any sharp/stabbing pain or radiation of sensation elsewhere. He denies any recent falls, fevers/chills, numbness/tingling down bilateral legs, bowel/bladder incontinence, shortness of breath, or chest pain. Discussed procedure with patient at bedside who is willing to move forward. Takes 81mg  ASA daily; last dose was on 11/11 prior to arrival. NPO since midnight. All questions and concerns answered at the bedside.   Patient is Full Code  Past Medical History:  Diagnosis Date   Diabetes mellitus    Gout    HTN (hypertension)    Morbid obesity (HCC)    Nephrolithiasis     Past Surgical History:  Procedure Laterality Date   CHOLECYSTECTOMY      Allergies: Crestor  [rosuvastatin  calcium ], Statins, and Sulfonamide derivatives  Medications: Prior to Admission medications   Medication Sig Start Date End Date Taking? Authorizing Provider  amLODipine  (NORVASC ) 10 MG tablet Take 1 tablet (10 mg total) by mouth daily. 05/06/20  Yes Gollan, Timothy J, MD  aspirin 81 MG EC tablet Take 81 mg by mouth daily.   Yes [provider]   colchicine 0.6 MG tablet Take 0.6 mg by mouth daily. Take 2 tablets by mouth on 1st onset of GOUT flare, follow in 1 hour with 1 tablet (maximum 3 tablets in a hour)   Yes [provider]  docusate sodium (COLACE) 100 MG capsule Take 100 mg by mouth daily as needed for mild constipation.   Yes [provider]  ezetimibe  (ZETIA ) 10 MG tablet TAKE 1 TABLET BY MOUTH EVERY DAY 02/19/20  Yes Gollan, Timothy J, MD  furosemide  (LASIX ) 40 MG tablet 1 tablet daily. Patient taking differently: Take 40 mg by mouth daily. 1 tablet daily. 05/15/13  Yes Dann Candyce RAMAN, MD  hydrALAZINE  (APRESOLINE ) 50 MG tablet Take 50 mg by mouth 3 (three) times daily. 10/29/23  Yes [provider]  linagliptin (TRADJENTA) 5 MG TABS tablet Take 5 mg by mouth daily.   Yes [provider]  losartan (COZAAR) 100 MG tablet Take 1 tablet by mouth daily. 11/01/13  Yes [provider]  LUMIGAN 0.01 % SOLN Place 1 drop into both eyes at bedtime. 08/03/23  Yes [provider]  ONE TOUCH ULTRA TEST test strip CHECK BLOOD SUGAR TWICE A DAY AS DIRECTED DX CODE: 250.02 07/26/14   [provider]     Family History  Problem Relation Age of Onset   Aneurysm Mother 25   Hypertension Father    Kidney failure Father    Coronary artery disease Neg Hx     Social History   Socioeconomic History   Marital status: Widowed    Spouse name: Not on file   Number  of children: Not on file   Years of education: Not on file   Highest education level: Not on file  Occupational History   Not on file  Tobacco Use   Smoking status: Former    Current packs/day: 0.50    Average packs/day: 0.5 packs/day for 15.0 years (7.5 ttl pk-yrs)    Types: Cigarettes   Smokeless tobacco: Never  Vaping Use   Vaping status: Never Used  Substance and Sexual Activity   Alcohol use: Yes    Comment: ocassionally   Drug use: No   Sexual activity: Not on file  Other Topics Concern   Not on file   Social History Narrative   Not on file   Social Drivers of Health   Financial Resource Strain: Not on file  Food Insecurity: No Food Insecurity (11/23/2023)   Hunger Vital Sign    Worried About Running Out of Food in the Last Year: Never true    Ran Out of Food in the Last Year: Never true  Transportation Needs: No Transportation Needs (11/23/2023)   PRAPARE - Administrator, Civil Service (Medical): No    Lack of Transportation (Non-Medical): No  Physical Activity: Not on file  Stress: Not on file  Social Connections: Moderately Integrated (11/23/2023)   Social Connection and Isolation Panel    Frequency of Communication with Friends and Family: More than three times a week    Frequency of Social Gatherings with Friends and Family: More than three times a week    Attends Religious Services: 1 to 4 times per year    Active Member of Clubs or Organizations: No    Attends Engineer, Structural: More than 4 times per year    Marital Status: Separated    Review of Systems  Musculoskeletal:  Positive for back pain (tingling in his lower back).  Patient denies any headache, chest pain, shortness of breath, abdominal pain, N/V, fever/chills, radiculopathy, or bowel/bladder incontinence. All other systems are negative.   Vital Signs: BP 138/89 (BP Location: Left Arm)   Pulse 70   Temp 98.4 F (36.9 C)   Resp 16   Ht 6' 1 (1.854 m)   Wt 220 lb (99.8 kg)   SpO2 100%   BMI 29.03 kg/m    Physical Exam Vitals reviewed.  Constitutional:      Appearance: Normal appearance.  HENT:     Mouth/Throat:     Mouth: Mucous membranes are moist.     Pharynx: Oropharynx is clear.  Cardiovascular:     Rate and Rhythm: Normal rate and regular rhythm.     Heart sounds: Normal heart sounds.  Pulmonary:     Effort: Pulmonary effort is normal.     Breath sounds: Normal breath sounds.  Abdominal:     General: Abdomen is flat.     Palpations: Abdomen is soft.      Tenderness: There is no abdominal tenderness.  Musculoskeletal:     Comments: Lumbar spine and sacrum non-tender to palpation; no point tenderness, deformities, swelling, or erythema   Skin:    General: Skin is warm and dry.  Neurological:     Mental Status: He is alert and oriented to person, place, and time.  Psychiatric:        Behavior: Behavior normal.     Imaging: MR Lumbar Spine W Wo Contrast Addendum Date: 11/21/2023 ADDENDUM #1 ADDENDUM: Study discussed by telephone with NP Harlene Patten at 09:39 hours on 11/21/2023. ---------------------------------------------------- Electronically signed  by: Helayne Hurst MD 11/21/2023 09:44 AM EST RP Workstation: [id number]   Result Date: 11/21/2023 ORIGINAL REPORT EXAM: MRI LUMBAR SPINE WITH AND WITHOUT INTRAVENOUS CONTRAST 11/17/2023 11:38:00 AM TECHNIQUE: Multiplanar multisequence MRI of the lumbar spine was performed with and without the administration of intravenous contrast. CONTRAST: 10 mL of Gadavist was administered. COMPARISON: None available. CLINICAL HISTORY: 78 year old male with left leg weakness, abnormal weight loss, and unspecified back pain for 2 months. FINDINGS: BONES AND ALIGNMENT: Lumbar segmentation appears to be normal. Relatively preserved lumbar lordosis. No significant scoliosis or spondylolisthesis. Normal vertebral body heights. Background bone marrow signal within normal limits. Advanced chronic lumbar disc and endplate degeneration further described below, with heterogeneous endplate marrow signal changes and Schmorl nodes. However, at the L5-S1 level there is confluent abnormal signal and enhancement within the disc space, confluent marrow edema at the endplates, especially anteriorly, and mild adjacent paraspinal soft tissue inflammation (on the left series 6 image 16, on the right series 9 image 25, and presacral stranding on series 6 image 11). No associated epidural inflammation at this time. Other degenerative changes  at that level are detailed below. SPINAL CORD: Conus medullaris partially visible at T12, grossly normal. No abnormality intradural enhancement. No abnormal dural thickening. And generally normal cauda equina nerve roots. SOFT TISSUES: No paraspinal mass. No paraspinal fluid collection. Other paraspinal soft tissues, visible abdominal viscera appear negative. Degenerative Changes: Mild lower thoracic spine degeneration for age. L1-L2: Disc space loss with circumferential disc bulge and moderate posterior element hypertrophy. No stenosis. L2-L3: More advanced, moderate to severe disc and endplate degeneration with bulky circumferential disc osteophyte complex. Moderate to severe ligament flavum and mild to moderate facet hypertrophy. Mild to moderate spinal stenosis (series 8 image 10). L3-L4: Severe chronic disc and endplate degeneration circumferential disc osteophyte complex. Right paracentral annular fissure of the disc (series 8 image 17). Severe ligament flavum, moderate to severe facet hypertrophy. Moderate to severe spinal stenosis with lateral recess stenosis greater on the right (same image). Moderate to severe left and mild to moderate right L3 neural foraminal stenosis. L4-L5: Moderate to severe disc degeneration, circumferential disc osteophyte complex. Moderate facet and ligament flavum hypertrophy greater on the right. Moderate spinal stenosis. Moderate to severe right lateral recess stenosis (descending right L5 nerve level series 8 image 22). Moderate left and moderate to severe right L4 neural foraminal stenosis. L5-S1: Active inflammation as detailed above, plus the following foci: circumferential disc osteophyte complex, especially far laterally on the right. Relatively mild posterior element hypertrophy. Mild bilateral lateral recess stenosis (descending S1 nerve levels) without spinal stenosis. Moderate bilateral L5 neural foraminal stenosis. IMPRESSION: 1. Abnormal L5-S1 level most compatible  with infectious Discitis-Osteomyelitis. Severe reactive changes to underlying degenerative disease is less likely (see #2). No spinal or paraspinal abscess. 2. Underlying Degenerative disease is Moderate to severe there and at most other lumbar levels. Subsequent degenerative moderate to severe multifactorial spinal, lateral recess, and left foraminal stenosis at L3-L4. Moderate spinal moderate to severe right lateral recess, and ight foraminal stenosis at L4-L5. Mild to moderate spinal stenosis at L2 L3. Electronically signed by: Helayne Hurst MD 11/21/2023 09:25 AM EST RP Workstation: [id number]    Labs:  CBC: Recent Labs    11/23/23 1116 11/24/23 0449  WBC 6.1 5.4  HGB 12.5* 11.3*  HCT 38.9* 34.8*  PLT 299 267    COAGS: Recent Labs    11/24/23 0449  INR 1.0    BMP: Recent Labs    11/23/23 1116 11/24/23 0449  NA 144 138  K 3.3* 4.1  CL 117* 103  CO2 17* 26  GLUCOSE 82 83  BUN 20 25*  CALCIUM  7.1* 8.8*  CREATININE 1.27* 1.63*  GFRNONAA 58* 43*    LIVER FUNCTION TESTS: Recent Labs    11/23/23 1116  BILITOT 0.3  AST 14*  ALT <5  ALKPHOS 63  PROT 5.8*  ALBUMIN 3.1*    TUMOR MARKERS: No results for input(s): AFPTM, CEA, CA199, CHROMGRNA in the last 8760 hours.  Assessment and Plan:  L5-S1 Discitis: Garrett Frank is a 78 y.o. male with a history of CKD, DM, and HTN who presented to the ED due to recent imaging concerning for lumbar discitis. Case and imaging reviewed and approved by Dr. ONEIDA Frank for possible L5-S1 disc aspiration.  Procedure to be performed under moderate sedation.  -On ASA 81mg  daily; last dose on 11/11 -Afebrile and no leukocytosis; Cultures pending -Infectious Disease on board; antibiotics pending aspiration results -Plan for L5-S1 disc aspiration today with Dr. KANDICE Banner if schedule allows  Risks and benefits of aspiration were discussed with the patient including bleeding, infection, damage to adjacent structures, and  sepsis.  All of the patient's questions were answered, patient is agreeable to proceed. Consent signed and in chart.   Thank you for allowing our service to participate in Garrett Frank 's care.    Electronically Signed: Glennon CHRISTELLA Bal, PA-C   11/24/2023, 9:30 AM     I spent a total of 40 Minutes in face to face in clinical consultation, greater than 50% of which was counseling/coordinating care for L5-S1 discitis.

## 2023-11-24 NOTE — Progress Notes (Signed)
 ID Streptococcus in 1 blood culture Blood culture X2 sent Start ceftriaxone Had Disc aspiration

## 2023-11-24 NOTE — Progress Notes (Signed)
 PHARMACY - PHYSICIAN COMMUNICATION CRITICAL VALUE ALERT - BLOOD CULTURE IDENTIFICATION (BCID)  Garrett Frank is an 78 y.o. male who presented to [facility] on 11/23/2023 with a chief complaint of discitis/osteomyelitis  Assessment: Lab called with [facility] in 2/2 bottles (1 set) from 11/11 blood cx. BCID showing strep species w/ no resistance detected.   Name of physician (or Provider) Contacted: Dr. Tobie, Dr. Fayette  Current antibiotics: None  Changes to prescribed antibiotics recommended:  Start Ceftriaxone 2 g q24h  Results for orders placed or performed during the hospital encounter of 11/23/23  Blood Culture ID Panel (Reflexed) (Collected: 11/23/2023  2:27 PM)  Result Value Ref Range   Enterococcus faecalis NOT DETECTED NOT DETECTED   Enterococcus Faecium NOT DETECTED NOT DETECTED   Listeria monocytogenes NOT DETECTED NOT DETECTED   Staphylococcus species NOT DETECTED NOT DETECTED   Staphylococcus aureus (BCID) NOT DETECTED NOT DETECTED   Staphylococcus epidermidis NOT DETECTED NOT DETECTED   Staphylococcus lugdunensis NOT DETECTED NOT DETECTED   Streptococcus species DETECTED (A) NOT DETECTED   Streptococcus agalactiae NOT DETECTED NOT DETECTED   Streptococcus pneumoniae NOT DETECTED NOT DETECTED   Streptococcus pyogenes NOT DETECTED NOT DETECTED   A.calcoaceticus-baumannii NOT DETECTED NOT DETECTED   Bacteroides fragilis NOT DETECTED NOT DETECTED   Enterobacterales NOT DETECTED NOT DETECTED   Enterobacter cloacae complex NOT DETECTED NOT DETECTED   Escherichia coli NOT DETECTED NOT DETECTED   Klebsiella aerogenes NOT DETECTED NOT DETECTED   Klebsiella oxytoca NOT DETECTED NOT DETECTED   Klebsiella pneumoniae NOT DETECTED NOT DETECTED   Proteus species NOT DETECTED NOT DETECTED   Salmonella species NOT DETECTED NOT DETECTED   Serratia marcescens NOT DETECTED NOT DETECTED   Haemophilus influenzae NOT DETECTED NOT DETECTED   Neisseria meningitidis NOT DETECTED NOT  DETECTED   Pseudomonas aeruginosa NOT DETECTED NOT DETECTED   Stenotrophomonas maltophilia NOT DETECTED NOT DETECTED   Candida albicans NOT DETECTED NOT DETECTED   Candida auris NOT DETECTED NOT DETECTED   Candida glabrata NOT DETECTED NOT DETECTED   Candida krusei NOT DETECTED NOT DETECTED   Candida parapsilosis NOT DETECTED NOT DETECTED   Candida tropicalis NOT DETECTED NOT DETECTED   Cryptococcus neoformans/gattii NOT DETECTED NOT DETECTED    Ransom Tobie PGY-1 Pharmacy Resident  [place] - [facility]  11/24/2023 4:12 PM

## 2023-11-24 NOTE — Care Plan (Signed)
 BC x2 drawn from lab. 10:26

## 2023-11-24 NOTE — Plan of Care (Signed)

## 2023-11-25 DIAGNOSIS — B954 Other streptococcus as the cause of diseases classified elsewhere: Secondary | ICD-10-CM | POA: Diagnosis not present

## 2023-11-25 DIAGNOSIS — B955 Unspecified streptococcus as the cause of diseases classified elsewhere: Secondary | ICD-10-CM

## 2023-11-25 DIAGNOSIS — M545 Low back pain, unspecified: Principal | ICD-10-CM

## 2023-11-25 DIAGNOSIS — M4627 Osteomyelitis of vertebra, lumbosacral region: Secondary | ICD-10-CM | POA: Diagnosis not present

## 2023-11-25 DIAGNOSIS — R7881 Bacteremia: Secondary | ICD-10-CM

## 2023-11-25 DIAGNOSIS — M4647 Discitis, unspecified, lumbosacral region: Secondary | ICD-10-CM | POA: Diagnosis not present

## 2023-11-25 LAB — GLUCOSE, CAPILLARY
Glucose-Capillary: 122 mg/dL — ABNORMAL HIGH (ref 70–99)
Glucose-Capillary: 142 mg/dL — ABNORMAL HIGH (ref 70–99)
Glucose-Capillary: 124 mg/dL — ABNORMAL HIGH (ref 70–99)
Glucose-Capillary: 109 mg/dL — ABNORMAL HIGH (ref 70–99)
Glucose-Capillary: 152 mg/dL — ABNORMAL HIGH (ref 70–99)

## 2023-11-25 NOTE — Plan of Care (Signed)

## 2023-11-25 NOTE — Progress Notes (Signed)
 Date of Admission:  11/23/2023      ID: BERTIS HUSTEAD is a 78 y.o. male  Principal Problem:   Low back pain Active Problems:   Mixed hyperlipidemia   Essential hypertension   Controlled type 2 diabetes mellitus without complication, without long-term current use of insulin (HCC)   Lower back pain    Subjective: Pt is doing well Back pain better No fever  Daughter Tasha at bed side  Medications:   amLODipine   10 mg Oral Daily   enoxaparin (LOVENOX) injection  40 mg Subcutaneous Q24H   ezetimibe   10 mg Oral Daily   furosemide   40 mg Oral Daily   insulin aspart  0-5 Units Subcutaneous QHS   insulin aspart  0-9 Units Subcutaneous TID WC    Objective: Vital signs in last 24 hours: Patient Vitals for the past 24 hrs:  BP Temp Temp src Pulse Resp SpO2  11/25/23 1457 132/62 98.7 F (37.1 C) Oral 69 18 100 %  11/25/23 0831 126/86 98.5 F (36.9 C) -- 66 15 100 %  11/25/23 0508 130/77 98.8 F (37.1 C) Oral 63 19 100 %  11/24/23 1941 124/80 98.2 F (36.8 C) Oral 65 17 100 %  11/24/23 1556 128/70 98.2 F (36.8 C) -- 65 16 99 %      PHYSICAL EXAM:  General: Alert, cooperative, no distress, appears stated age.  Head: Normocephalic, without obvious abnormality, atraumatic. Eyes: Conjunctivae clear, anicteric sclerae. Pupils are equal ENT Nares normal. No drainage or sinus tenderness. Lips, mucosa, and tongue normal. No Thrush Neck: Supple, symmetrical, no adenopathy, thyroid : non tender no carotid bruit and no JVD. Back: No CVA tenderness. Lungs: Clear to auscultation bilaterally. No Wheezing or Rhonchi. No rales. Heart: Regular rate and rhythm, no murmur, rub or gallop. Abdomen: Soft, non-tender,not distended. Bowel sounds normal. No masses Extremities: atraumatic, no cyanosis. No edema. No clubbing Skin: No rashes or lesions. Or bruising Lymph: Cervical, supraclavicular normal. Neurologic: Grossly non-focal  Lab Results    Latest Ref Rng & Units 11/24/2023     4:49 AM 11/23/2023   11:16 AM 08/10/2015    3:04 PM  CBC  WBC 4.0 - 10.5 K/uL 5.4  6.1  11.4   Hemoglobin 13.0 - 17.0 g/dL 88.6  87.4  85.5   Hematocrit 39.0 - 52.0 % 34.8  38.9  42.0   Platelets 150 - 400 K/uL 267  299  251        Latest Ref Rng & Units 11/24/2023    4:49 AM 11/23/2023   11:16 AM 07/10/2019    4:54 PM  CMP  Glucose 70 - 99 mg/dL 83  82  896   BUN 8 - 23 mg/dL 25  20  21    Creatinine 0.61 - 1.24 mg/dL 8.36  8.72  8.49   Sodium 135 - 145 mmol/L 138  144  138   Potassium 3.5 - 5.1 mmol/L 4.1  3.3  4.0   Chloride 98 - 111 mmol/L 103  117  104   CO2 22 - 32 mmol/L 26  17  25    Calcium  8.9 - 10.3 mg/dL 8.8  7.1  8.9   Total Protein 6.5 - 8.1 g/dL  5.8  7.9   Total Bilirubin 0.0 - 1.2 mg/dL  0.3  0.5   Alkaline Phos 38 - 126 U/L  63  75   AST 15 - 41 U/L  14  18   ALT 0 - 44 U/L  <5  16       Microbiology:  Studies/Results: CT GUIDED NEEDLE PLACEMENT Result Date: 11/24/2023 INDICATION: Suspected L5-S1 discitis EXAM: CT-guided needle aspiration TECHNIQUE: Multidetector CT imaging of the lumbar region was performed following the standard protocol without IV contrast. RADIATION DOSE REDUCTION: This exam was performed according to the departmental dose-optimization program which includes automated exposure control, adjustment of the mA and/or kV according to patient size and/or use of iterative reconstruction technique. MEDICATIONS: The patient is currently admitted to the hospital and receiving intravenous antibiotics. The antibiotics were administered within an appropriate time frame prior to the initiation of the procedure. ANESTHESIA/SEDATION: Moderate (conscious) sedation was employed during this procedure. A total of Versed 1.5 mg and Fentanyl 75 mcg was administered intravenously by the radiology nurse. Total intra-service moderate Sedation Time: 12 minutes. The patient's level of consciousness and vital signs were monitored continuously by radiology nursing throughout  the procedure under my direct supervision. COMPLICATIONS: None immediate. PROCEDURE: Informed written consent was obtained from the patient after a thorough discussion of the procedural risks, benefits and alternatives. All questions were addressed. Maximal Sterile Barrier Technique was utilized including caps, mask, sterile gowns, sterile gloves, sterile drape, hand hygiene and skin antiseptic. A timeout was performed prior to the initiation of the procedure. With the patient in a prone position, radiopaque markers were placed on the lumbar spine. Axial images were obtained, evaluated, and reviewed. Measurements were obtained. Patient's skin was then marked, prepped, and draped in the usual sterile fashion. Local anesthesia was achieved by infiltrating 1% lidocaine into the subcutaneous tissue to the level of the L5-S1 disc space. An 18 gauge needle was then advanced through a small incision in the right paraspinal region. The needle was advanced and repeat axial imaging was performed to redirect the needle as necessary. When the needle was at the disc space, the stylet was removed and aspiration was performed. A small sample of fluid was then obtained and sent to pathology for culture and sensitivity. IMPRESSION: Successful CT-guided disc aspiration of L5-S1 Electronically Signed   By: Cordella Banner   On: 11/24/2023 16:35     Assessment/Plan: 78 year old male with history of hypertension, diabetes mellitus presents with back pain for 3 months and abnormal MRI  Streptococcus cristatus  bacteremia 1 of 2 sets.  This bacteria belongs to Streptococcus mitis group and is associated with endocarditis. Susceptibility pending Continue ceftriaxone Will get 2D echo  Discitis/osteomyelitis involving L5-S1 vertebrae. The disc was aspirated yesterday Culture pending Sed rate is 25 Patient is going to need milligrams weekly of antibiotics intravenously  Diabetes mellitus management as per primary  team  Hypertension management as per primary team  CKD  Discussed the management with the patient and his daughter at bedside. Discussed with ID pharmacist.

## 2023-11-25 NOTE — Progress Notes (Signed)
 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 Will repeat blood cx tomorrow. S/p aspiration of L5-S1 disc and cx is pending. No hardware, recent falls and/or trauma as per pt   Streptococcal bacteremia: blood cxs growing strept cristatus, sens pending. Continue on IV rocephin. ID consulted.   HLD: continue on zetia   HTN: continue on amlodipine     DM2: well controlled. Continue on SSI w/ accuchecks   Gout: continue on home dose of colchicine    Ankle edema: continue on home dose of lasix          DVT prophylaxis: lovenox  Code Status: full  Family Communication:  Disposition Plan: likely d/c back home   Level of care: Med-Surg Consultants:  ID IR  Procedures:   Antimicrobials: rocephin    Subjective: Pt c/o fatigue   Objective: Vitals:   11/24/23 1556 11/24/23 1941 11/25/23 0508 11/25/23 0831  BP: 128/70 124/80 130/77 126/86  Pulse: 65 65 63 66  Resp: 16 17 19 15   Temp: 98.2 F (36.8 C) 98.2 F (36.8 C) 98.8 F (37.1 C) 98.5 F (36.9 C)  TempSrc:  Oral Oral   SpO2: 99% 100% 100% 100%  Weight:      Height:        Intake/Output Summary (Last 24 hours) at 11/25/2023 0952 Last data filed at 11/24/2023 1700 Gross per 24 hour  Intake 277.89 ml  Output --  Net 277.89 ml   Filed Weights   11/23/23 1018  Weight: 99.8 kg    Examination:  General exam: Appears calm and comfortable  Respiratory system: Clear to auscultation. Respiratory effort normal. Cardiovascular system: S1 & S2+. No rubs, gallops or clicks.  Gastrointestinal system: Abdomen is  nondistended, soft and nontender. Normal bowel sounds heard. Central nervous system: Alert and oriented. Moves all extremities Psychiatry: Judgement and insight appear normal. Mood & affect appropriate.     Data Reviewed: I have personally reviewed following labs and imaging studies  CBC: Recent Labs  Lab 11/23/23 1116 11/24/23 0449  WBC 6.1 5.4  NEUTROABS 3.2  --   HGB 12.5* 11.3*  HCT 38.9* 34.8*  MCV 90.0 89.7  PLT 299 267   Basic Metabolic Panel: Recent Labs  Lab 11/23/23 1116 11/24/23 0449  NA 144 138  K 3.3* 4.1  CL 117* 103  CO2 17* 26  GLUCOSE 82 83  BUN 20 25*  CREATININE 1.27* 1.63*  CALCIUM  7.1* 8.8*   GFR: Estimated Creatinine Clearance: 46.4 mL/min (A) (by C-G formula based on SCr of 1.63 mg/dL (H)). Liver Function Tests: Recent Labs  Lab 11/23/23 1116  AST 14*  ALT <5  ALKPHOS 63  BILITOT 0.3  PROT 5.8*  ALBUMIN 3.1*   No results for input(s): LIPASE, AMYLASE in the last 168 hours. No results for input(s): AMMONIA in the last 168 hours. Coagulation Profile: Recent Labs  Lab 11/24/23 0449  INR 1.0   Cardiac Enzymes: No results for input(s): CKTOTAL, CKMB, CKMBINDEX, TROPONINI in the last 168 hours. BNP (last 3 results) No results for input(s): PROBNP in the last 8760 hours. HbA1C: Recent Labs  11/23/23 1750  HGBA1C 6.1*   CBG: Recent Labs  Lab 11/24/23 1216 11/24/23 1558 11/24/23 1609 11/24/23 2025 11/25/23 0828  GLUCAP 77 218* 211* 123* 122*   Lipid Profile: No results for input(s): CHOL, HDL, LDLCALC, TRIG, CHOLHDL, LDLDIRECT in the last 72 hours. Thyroid  Function Tests: No results for input(s): TSH, T4TOTAL, FREET4, T3FREE, THYROIDAB in the last 72 hours. Anemia Panel: No results for input(s): VITAMINB12, FOLATE, FERRITIN, TIBC, IRON, RETICCTPCT in the last 72 hours. Sepsis Labs: Recent Labs  Lab 11/23/23 1117  LATICACIDVEN 1.2    Recent Results (from the past  240 hours)  Blood culture (routine x 2)     Status: None (Preliminary result)   Collection Time: 11/23/23  2:27 PM   Specimen: BLOOD  Result Value Ref Range Status   Specimen Description   Final    BLOOD RIGHT ANTECUBITAL Performed at [facility], [address] [place], [place] [address]    Special Requests   Final    BOTTLES DRAWN AEROBIC AND ANAEROBIC Blood Culture results may not be optimal due to an inadequate volume of blood received in culture bottles Performed at [facility], [address] [place], [place] [address]    Culture  Setup Time   Final    GRAM POSITIVE COCCI IN BOTH AEROBIC AND ANAEROBIC BOTTLES CRITICAL RESULT CALLED TO, READ BACK BY AND VERIFIED WITH: RAQUAL RODRIGUEZ PHARMD, [phone] 11/24/2023 KC GRAM STAIN REVIEWED-AGREE WITH RESULT DRT Performed at [facility], [address] [place], [place] [address]    Culture GRAM POSITIVE COCCI  Final   Report Status PENDING  Incomplete  Blood Culture ID Panel (Reflexed)     Status: Abnormal   Collection Time: 11/23/23  2:27 PM  Result Value Ref Range Status   Enterococcus faecalis NOT DETECTED NOT DETECTED Final   Enterococcus Faecium NOT DETECTED NOT DETECTED Final   Listeria monocytogenes NOT DETECTED NOT DETECTED Final   Staphylococcus species NOT DETECTED NOT DETECTED Final   Staphylococcus aureus (BCID) NOT DETECTED NOT DETECTED Final   Staphylococcus epidermidis NOT DETECTED NOT DETECTED Final   Staphylococcus lugdunensis NOT DETECTED NOT DETECTED Final   Streptococcus species DETECTED (A) NOT DETECTED Final    Comment: Not Enterococcus species, Streptococcus agalactiae, Streptococcus pyogenes, or Streptococcus pneumoniae. CRITICAL RESULT CALLED TO, READ BACK BY AND VERIFIED WITH: DELORSE STALLION PHARMD, [phone] 11/24/2023 KC    Streptococcus agalactiae NOT DETECTED NOT DETECTED Final   Streptococcus pneumoniae NOT DETECTED NOT DETECTED Final   Streptococcus pyogenes NOT DETECTED  NOT DETECTED Final   A.calcoaceticus-baumannii NOT DETECTED NOT DETECTED Final   Bacteroides fragilis NOT DETECTED NOT DETECTED Final   Enterobacterales NOT DETECTED NOT DETECTED Final   Enterobacter cloacae complex NOT DETECTED NOT DETECTED Final   Escherichia coli NOT DETECTED NOT DETECTED Final   Klebsiella aerogenes NOT DETECTED NOT DETECTED Final   Klebsiella oxytoca NOT DETECTED NOT DETECTED Final   Klebsiella pneumoniae NOT DETECTED NOT DETECTED Final   Proteus species NOT DETECTED NOT DETECTED Final   Salmonella species NOT DETECTED NOT DETECTED Final   Serratia marcescens NOT DETECTED NOT DETECTED Final   Haemophilus influenzae NOT DETECTED NOT DETECTED Final   Neisseria meningitidis NOT DETECTED NOT DETECTED Final   Pseudomonas aeruginosa NOT DETECTED NOT DETECTED Final   Stenotrophomonas maltophilia NOT DETECTED NOT DETECTED Final   Candida albicans NOT DETECTED NOT DETECTED Final   Candida auris NOT DETECTED NOT DETECTED Final   Candida glabrata NOT DETECTED NOT DETECTED Final  Candida krusei NOT DETECTED NOT DETECTED Final   Candida parapsilosis NOT DETECTED NOT DETECTED Final   Candida tropicalis NOT DETECTED NOT DETECTED Final   Cryptococcus neoformans/gattii NOT DETECTED NOT DETECTED Final    Comment: Performed at [facility], [address] [place], [place] [address]  Blood culture (routine x 2)     Status: None (Preliminary result)   Collection Time: 11/23/23  3:20 PM   Specimen: BLOOD  Result Value Ref Range Status   Specimen Description BLOOD LEFT ANTECUBITAL  Final   Special Requests   Final    BOTTLES DRAWN AEROBIC AND ANAEROBIC Blood Culture results may not be optimal due to an inadequate volume of blood received in culture bottles   Culture   Final    NO GROWTH 2 DAYS Performed at [facility], [address], [place], [place] [address]    Report Status PENDING  Incomplete  Fungus culture, blood     Status: None (Preliminary result)    Collection Time: 11/23/23  3:20 PM   Specimen: BLOOD  Result Value Ref Range Status   Specimen Description BLOOD BLOOD LEFT FOREARM  Final   Special Requests   Final    BOTTLES DRAWN AEROBIC AND ANAEROBIC Blood Culture results may not be optimal due to an inadequate volume of blood received in culture bottles Performed at [facility], [address] [place], [place] [address]    Culture PENDING  Incomplete   Report Status PENDING  Incomplete  Culture, blood (Routine X 2) w Reflex to ID Panel     Status: None (Preliminary result)   Collection Time: 11/24/23 10:19 AM   Specimen: BLOOD  Result Value Ref Range Status   Specimen Description BLOOD BLOOD RIGHT HAND  Final   Special Requests   Final    BOTTLES DRAWN AEROBIC AND ANAEROBIC Blood Culture adequate volume   Culture   Final    NO GROWTH < 24 HOURS Performed at [facility], [address], [place], [place] [address]    Report Status PENDING  Incomplete  Culture, blood (Routine X 2) w Reflex to ID Panel     Status: None (Preliminary result)   Collection Time: 11/24/23 10:20 AM   Specimen: BLOOD  Result Value Ref Range Status   Specimen Description BLOOD BLOOD LEFT HAND  Final   Special Requests   Final    BOTTLES DRAWN AEROBIC AND ANAEROBIC Blood Culture adequate volume   Culture   Final    NO GROWTH < 24 HOURS Performed at [facility], [address] [place], [place] [address]    Report Status PENDING  Incomplete  Aerobic/Anaerobic Culture w Gram Stain (surgical/deep wound)     Status: None (Preliminary result)   Collection Time: 11/24/23  1:09 PM   Specimen: Fluid; Tissue  Result Value Ref Range Status   Specimen Description   Final    FLUID Performed at [facility], [address] [place], [place] [address]    Special Requests   Final    [facility] Performed at [facility], [address] [place], [place] [address]    Gram Stain NO WBC SEEN NO ORGANISMS SEEN    Final   Culture   Final    NO GROWTH < 12 HOURS Performed at [facility], [address], [place], [place] [address]    Report Status PENDING  Incomplete         Radiology Studies: CT GUIDED NEEDLE  PLACEMENT Result Date: 11/24/2023 INDICATION: Suspected L5-S1 discitis EXAM: CT-guided needle aspiration TECHNIQUE: Multidetector CT imaging of the lumbar region was performed following the standard protocol without IV contrast. RADIATION DOSE REDUCTION: This exam was performed according to the departmental dose-optimization program which includes automated exposure control, adjustment of the mA and/or kV according to patient size and/or use of iterative reconstruction technique. MEDICATIONS: The patient is currently admitted to the hospital and receiving intravenous antibiotics. The antibiotics were administered within an appropriate time frame prior to the initiation of the procedure. ANESTHESIA/SEDATION: Moderate (conscious) sedation was employed during this procedure. A total of Versed 1.5 mg and Fentanyl 75 mcg was administered intravenously by the radiology nurse. Total intra-service moderate Sedation Time: 12 minutes. The patient's level of consciousness and vital signs were monitored continuously by radiology nursing throughout the procedure under my direct supervision. COMPLICATIONS: None immediate. PROCEDURE: Informed written consent was obtained from the patient after a thorough discussion of the procedural risks, benefits and alternatives. All questions were addressed. Maximal Sterile Barrier Technique was utilized including caps, mask, sterile gowns, sterile gloves, sterile drape, hand hygiene and skin antiseptic. A timeout was performed prior to the initiation of the procedure. With the patient in a prone position, radiopaque markers were placed on the lumbar spine. Axial images were obtained, evaluated, and reviewed. Measurements were obtained. Patient's skin was then marked, prepped,  and draped in the usual sterile fashion. Local anesthesia was achieved by infiltrating 1% lidocaine into the subcutaneous tissue to the level of the L5-S1 disc space. An 18 gauge needle was then advanced through a small incision in the right paraspinal region. The needle was advanced and repeat axial imaging was performed to redirect the needle as necessary. When the needle was at the disc space, the stylet was removed and aspiration was performed. A small sample of fluid was then obtained and sent to pathology for culture and sensitivity. IMPRESSION: Successful CT-guided disc aspiration of L5-S1 Electronically Signed   By: Cordella Banner   On: 11/24/2023 16:35        Scheduled Meds:  amLODipine   10 mg Oral Daily   enoxaparin (LOVENOX) injection  40 mg Subcutaneous Q24H   ezetimibe   10 mg Oral Daily   furosemide   40 mg Oral Daily   insulin aspart  0-5 Units Subcutaneous QHS   insulin aspart  0-9 Units Subcutaneous TID WC   Continuous Infusions:  cefTRIAXone (ROCEPHIN)  IV 2 g (11/24/23 1104)     LOS: 1 day       Anthony CHRISTELLA Pouch, MD [facility] Pager [phone]  If 7PM-7AM, please contact night-coverage [email or web address] 11/25/2023, 9:52 AM

## 2023-11-26 ENCOUNTER — Other Ambulatory Visit: Payer: Self-pay

## 2023-11-26 ENCOUNTER — Inpatient Hospital Stay
Admit: 2023-11-26 | Discharge: 2023-11-26 | Disposition: A | Discharge status: Home / Self Care | Attending: Infectious Diseases | Admitting: Infectious Diseases

## 2023-11-26 ENCOUNTER — Inpatient Hospital Stay

## 2023-11-26 DIAGNOSIS — M4627 Osteomyelitis of vertebra, lumbosacral region: Secondary | ICD-10-CM | POA: Diagnosis not present

## 2023-11-26 DIAGNOSIS — M4647 Discitis, unspecified, lumbosacral region: Secondary | ICD-10-CM | POA: Diagnosis not present

## 2023-11-26 DIAGNOSIS — M4646 Discitis, unspecified, lumbar region: Secondary | ICD-10-CM

## 2023-11-26 DIAGNOSIS — B954 Other streptococcus as the cause of diseases classified elsewhere: Secondary | ICD-10-CM | POA: Diagnosis not present

## 2023-11-26 DIAGNOSIS — R7881 Bacteremia: Secondary | ICD-10-CM | POA: Diagnosis not present

## 2023-11-26 LAB — BASIC METABOLIC PANEL WITH GFR
Anion gap: 7 (ref 5–15)
BUN: 26 mg/dL — ABNORMAL HIGH (ref 8–23)
CO2: 27 mmol/L (ref 22–32)
Calcium: 8.6 mg/dL — ABNORMAL LOW (ref 8.9–10.3)
Chloride: 103 mmol/L (ref 98–111)
Creatinine, Ser: 1.67 mg/dL — ABNORMAL HIGH (ref 0.61–1.24)
GFR, Estimated: 42 mL/min — ABNORMAL LOW (ref >60)
Glucose, Bld: 122 mg/dL — ABNORMAL HIGH (ref 70–99)
Potassium: 4.4 mmol/L (ref 3.5–5.1)
Sodium: 137 mmol/L (ref 135–145)

## 2023-11-26 LAB — CULTURE, BLOOD (ROUTINE X 2)

## 2023-11-26 LAB — GLUCOSE, CAPILLARY
Glucose-Capillary: 121 mg/dL — ABNORMAL HIGH (ref 70–99)
Glucose-Capillary: 88 mg/dL (ref 70–99)
Glucose-Capillary: 169 mg/dL — ABNORMAL HIGH (ref 70–99)
Glucose-Capillary: 107 mg/dL — ABNORMAL HIGH (ref 70–99)

## 2023-11-26 LAB — ECHOCARDIOGRAM COMPLETE
AR max vel: 3.94 cm2
AV Peak grad: 4.8 mmHg
Ao pk vel: 1.1 m/s
Area-P 1/2: 3.21 cm2
Height: 73 in
S' Lateral: 3.1 cm
Weight: 3520 [oz_av]

## 2023-11-26 MED ORDER — CHLORHEXIDINE GLUCONATE CLOTH 2 % EX PADS
6.0000 | MEDICATED_PAD | Freq: Every day | CUTANEOUS | Status: DC
  Administered 2023-11-27: 6 via TOPICAL

## 2023-11-26 MED ORDER — SODIUM CHLORIDE 0.9% FLUSH: 10.0000 mL | INTRAVENOUS | Status: DC | PRN

## 2023-11-26 MED ORDER — SODIUM CHLORIDE 0.9% FLUSH
10.0000 mL | Freq: Two times a day (BID) | INTRAVENOUS | Status: DC
  Administered 2023-11-26 – 2023-11-27 (×2): 10 mL

## 2023-11-26 MED ORDER — HYDRALAZINE HCL 20 MG/ML IJ SOLN: 20.0000 mg | Freq: Four times a day (QID) | INTRAMUSCULAR | Status: DC | PRN

## 2023-11-26 NOTE — Progress Notes (Signed)
 PHARMACY CONSULT NOTE FOR:  OUTPATIENT  PARENTERAL ANTIBIOTIC THERAPY (OPAT)  Indication: Streptococcus bacteremia and discitis  Regimen: Ceftriaxone 2 g IV q24h  End date: 01/04/2024  Labs - Once weekly:  CBC/D and CMP, Labs - Once weekly: ESR and CRP Fax weekly lab results  promptly to [phone] X_ Please pull PIC at completion of IV antibiotics  Call [phone] with critical value or question  IV antibiotic discharge orders are pended. To discharging provider:  please sign these orders via discharge navigator,  Select New Orders & click on the button choice - Manage This Unsigned Work.     Thank you for allowing pharmacy to be a part of this patient's care.  Ransom Blanch PGY-1 Pharmacy Resident  [facility]  11/26/2023 2:17 PM

## 2023-11-26 NOTE — Plan of Care (Signed)
  Problem: Coping: Goal: Ability to adjust to condition or change in health will improve Outcome: Progressing   Problem: Metabolic: Goal: Ability to maintain appropriate glucose levels will improve Outcome: Progressing   Problem: Skin Integrity: Goal: Risk for impaired skin integrity will decrease Outcome: Progressing   Problem: Activity: Goal: Risk for activity intolerance will decrease Outcome: Progressing   Problem: Pain Managment: Goal: General experience of comfort will improve and/or be controlled Outcome: Progressing   Problem: Safety: Goal: Ability to remain free from injury will improve Outcome: Progressing

## 2023-11-26 NOTE — Progress Notes (Signed)
 Date of Admission:  11/23/2023      ID: OSLO HUNTSMAN is a 78 y.o. male  Principal Problem:   Low back pain Active Problems:   Mixed hyperlipidemia   Essential hypertension   Controlled type 2 diabetes mellitus without complication, without long-term current use of insulin (HCC)   Lower back pain   Bacteremia due to Streptococcus   Lumbar pain    Subjective: Patient has no complaints.  His back pain is better   Medications:   amLODipine   10 mg Oral Daily   [START ON 11/27/2023] Chlorhexidine Gluconate Cloth  6 each Topical Daily   enoxaparin (LOVENOX) injection  40 mg Subcutaneous Q24H   ezetimibe   10 mg Oral Daily   furosemide   40 mg Oral Daily   insulin aspart  0-5 Units Subcutaneous QHS   insulin aspart  0-9 Units Subcutaneous TID WC   sodium chloride flush  10-40 mL Intracatheter Q12H    Objective: Vital signs in last 24 hours: Patient Vitals for the past 24 hrs:  BP Temp Pulse Resp SpO2  11/26/23 1951 131/80 98.7 F (37.1 C) 75 17 98 %  11/26/23 1700 (!) 140/75 -- -- -- --  11/26/23 1542 (!) 162/93 98.7 F (37.1 C) 79 18 100 %  11/26/23 0819 (!) 152/84 98.5 F (36.9 C) 76 19 100 %  11/26/23 0449 131/79 98.3 F (36.8 C) 64 18 99 %      PHYSICAL EXAM:  General: Alert, cooperative, no distress, appears stated age.  Head: Normocephalic, without obvious abnormality, atraumatic. Eyes: Conjunctivae clear, anicteric sclerae. Pupils are equal ENT Nares normal. No drainage or sinus tenderness. Lips, mucosa, and tongue normal. No Thrush Neck: Supple, symmetrical, no adenopathy, thyroid : non tender no carotid bruit and no JVD. Back: No CVA tenderness. Lungs: Clear to auscultation bilaterally. No Wheezing or Rhonchi. No rales. Heart: Regular rate and rhythm, no murmur, rub or gallop. Abdomen: Soft, non-tender,not distended. Bowel sounds normal. No masses Extremities: atraumatic, no cyanosis. No edema. No clubbing Skin: No rashes or lesions. Or bruising Lymph:  Cervical, supraclavicular normal. Neurologic: Grossly non-focal  Lab Results    Latest Ref Rng & Units 11/24/2023    4:49 AM 11/23/2023   11:16 AM 08/10/2015    3:04 PM  CBC  WBC 4.0 - 10.5 K/uL 5.4  6.1  11.4   Hemoglobin 13.0 - 17.0 g/dL 88.6  87.4  85.5   Hematocrit 39.0 - 52.0 % 34.8  38.9  42.0   Platelets 150 - 400 K/uL 267  299  251        Latest Ref Rng & Units 11/26/2023    3:40 AM 11/24/2023    4:49 AM 11/23/2023   11:16 AM  CMP  Glucose 70 - 99 mg/dL 877  83  82   BUN 8 - 23 mg/dL 26  25  20    Creatinine 0.61 - 1.24 mg/dL 8.32  8.36  8.72   Sodium 135 - 145 mmol/L 137  138  144   Potassium 3.5 - 5.1 mmol/L 4.4  4.1  3.3   Chloride 98 - 111 mmol/L 103  103  117   CO2 22 - 32 mmol/L 27  26  17    Calcium  8.9 - 10.3 mg/dL 8.6  8.8  7.1   Total Protein 6.5 - 8.1 g/dL   5.8   Total Bilirubin 0.0 - 1.2 mg/dL   0.3   Alkaline Phos 38 - 126 U/L   63   AST  15 - 41 U/L   14   ALT 0 - 44 U/L   <5       Microbiology: 11/23/2023 1 set of blood culture has Streptococcus cristatus and Granulicatella 11/12 BC- Sent 11/12 disc aspiration culture  Studies/Results: US  EKG SITE RITE Result Date: 11/26/2023 If Site Rite image not attached, placement could not be confirmed due to current cardiac rhythm.  ECHOCARDIOGRAM COMPLETE Result Date: 11/26/2023    ECHOCARDIOGRAM REPORT   Patient Name:   TESEAN STUMP Conant Date of Exam: 11/26/2023 Medical Rec #:  [id number]     Height:       73.0 in Accession #:    [id number]    Weight:       220.0 lb Date of Birth:  [birth year]      BSA:          2.241 m Patient Age:    78 years      BP:           131/79 mmHg Patient Gender: M             HR:           66 bpm. Exam Location:  [facility] Procedure: 2D Echo, Cardiac Doppler and Color Doppler (Both Spectral and Color            Flow Doppler were utilized during procedure). Indications:     Bacteremia R78.81  History:         Patient has prior history of Echocardiogram examinations, most                   recent 02/04/2018. CKD, stage 3, Signs/Symptoms:Chest Pain and                  Dyspnea; Risk Factors:Hypertension, Diabetes and Dyslipidemia.  Sonographer:     Thea Norlander RCS Referring Phys:  [id number] DONALD BERLIN Diagnosing Phys: Caron Poser IMPRESSIONS  1. Left ventricular ejection fraction, by estimation, is 55 to 60%. The left ventricle has normal function. Left ventricular endocardial border not optimally defined to evaluate regional wall motion. Left ventricular diastolic parameters were normal.  2. Right ventricular systolic function is normal. The right ventricular size is normal.  3. The inferior vena cava is normal in size with greater than 50% respiratory variability, suggesting right atrial pressure of 3 mmHg. Comparison(s): A prior study was performed on 02/04/2018. Prior images unable to be directly viewed, comparison made by report only. No significant change from prior study. Conclusion(s)/Recommendation(s): No evidence of valvular vegetations on this transthoracic echocardiogram. Consider a transesophageal echocardiogram to exclude infective endocarditis if clinically indicated. FINDINGS  Left Ventricle: Left ventricular ejection fraction, by estimation, is 55 to 60%. The left ventricle has normal function. Left ventricular endocardial border not optimally defined to evaluate regional wall motion. The left ventricular internal cavity size was normal in size. There is no left ventricular hypertrophy. Left ventricular diastolic parameters were normal. Right Ventricle: The right ventricular size is normal. Right vetricular wall thickness was not well visualized. Right ventricular systolic function is normal. Left Atrium: Left atrial size was normal in size. Right Atrium: Right atrial size was normal in size. Pericardium: There is no evidence of pericardial effusion. Mitral Valve: The mitral valve is degenerative in appearance. There is mild calcification of the mitral valve leaflet(s).  Moderate mitral annular calcification. No evidence of mitral valve regurgitation. No evidence of mitral valve stenosis. Tricuspid Valve: The tricuspid valve is not well visualized. Tricuspid valve regurgitation is not  demonstrated. No evidence of tricuspid stenosis. Aortic Valve: The aortic valve was not well visualized. Aortic valve regurgitation is trivial. Aortic valve sclerosis/calcification is present, without any evidence of aortic stenosis. Aortic valve peak gradient measures 4.8 mmHg. Pulmonic Valve: The pulmonic valve was not well visualized. Pulmonic valve regurgitation is not visualized. No evidence of pulmonic stenosis. Aorta: The aortic root and ascending aorta are structurally normal, with no evidence of dilitation. Venous: The inferior vena cava is normal in size with greater than 50% respiratory variability, suggesting right atrial pressure of 3 mmHg. IAS/Shunts: The interatrial septum was not well visualized.  LEFT VENTRICLE PLAX 2D LVIDd:         4.60 cm   Diastology LVIDs:         3.10 cm   LV e' medial:    10.10 cm/s LV PW:         1.00 cm   LV E/e' medial:  7.6 LV IVS:        0.90 cm   LV e' lateral:   8.81 cm/s LVOT diam:     2.40 cm   LV E/e' lateral: 8.7 LV SV:         84 LV SV Index:   37 LVOT Area:     4.52 cm  RIGHT VENTRICLE             IVC RV S prime:     12.10 cm/s  IVC diam: 1.20 cm TAPSE (M-mode): 2.5 cm LEFT ATRIUM             Index        RIGHT ATRIUM           Index LA diam:        2.60 cm 1.16 cm/m   RA Area:     14.30 cm LA Vol (A2C):   44.3 ml 19.77 ml/m  RA Volume:   33.90 ml  15.13 ml/m LA Vol (A4C):   38.6 ml 17.23 ml/m LA Biplane Vol: 42.6 ml 19.01 ml/m  AORTIC VALVE AV Area (Vmax): 3.94 cm AV Vmax:        110.00 cm/s AV Peak Grad:   4.8 mmHg LVOT Vmax:      95.90 cm/s LVOT Vmean:     60.800 cm/s LVOT VTI:       0.185 m  AORTA Ao Root diam: 3.50 cm Ao Asc diam:  3.20 cm MITRAL VALVE MV Area (PHT): 3.21 cm    SHUNTS MV Decel Time: 236 msec    Systemic VTI:  0.18 m MV  E velocity: 76.50 cm/s  Systemic Diam: 2.40 cm MV A velocity: 82.80 cm/s MV E/A ratio:  0.92 Caron Poser Electronically signed by Caron Poser Signature Date/Time: 11/26/2023/1:43:51 PM    Final      Assessment/Plan: 78 year old male with history of hypertension, diabetes mellitus presents with back pain for 3 months and abnormal MRI  Streptococcus cristatus  bacteremia 1 of 2 sets.  This bacteria belongs to Streptococcus mitis group and can be associated with endocarditis. There is Granulicatella in the blood culture as well Is this a true pathogen or both organisms contaminants Will have to treat like a pathogen as they can be oral and GI organisms Susceptibility pending Continue ceftriaxone 2 d echo limited study But as he will be getting 6 weeks of IV - TEE is not needed  Discitis/osteomyelitis involving L5-S1 vertebrae. The disc was aspirated yesterday after he received one  dose of ceftriaxone  Culture pending Sed  rate is 25 Patient is going to need 6 weeks of antibiotic intravenously  Diabetes mellitus management as per primary team  Hypertension management as per primary team  CKD  Discussed the management with the patient  Discussed with ID pharmacist.  OPAT note written ID will follow him as OP

## 2023-11-26 NOTE — Progress Notes (Addendum)
 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 Repeat blood cxs NGTD. S/p aspiration of L5-S1 disc and cx is pending. No hardware, recent falls and/or trauma as per pt  Streptococcal bacteremia: blood cxs growing strept cristatus, sens pending. Continue on IV rocephin x 6 weeks until 12/23/5 as per ID. Echo did not show any vegetations.   HLD: continue on zetia   HTN: continue on amlodipine     DM2: well controlled. Continue on SSI w/ accuchecks  Gout: continue on home dose of colchicine    Ankle edema: continue on home dose of lasix         DVT prophylaxis: lovenox  Code Status: full  Family Communication:  Disposition Plan: likely d/c back home   Level of care: Med-Surg Consultants:  ID IR  Procedures:   Antimicrobials: rocephin    Subjective: Pt c/o malaise  Objective: Vitals:   11/25/23 1457 11/25/23 2020 11/26/23 0449 11/26/23 0819  BP: 132/62 133/77 131/79 (!) 152/84  Pulse: 69 64 64 76  Resp: 18 16 18 19   Temp: 98.7 F (37.1 C) 97.9 F (36.6 C) 98.3 F (36.8 C) 98.5 F (36.9 C)  TempSrc: Oral     SpO2: 100% 100% 99% 100%  Weight:      Height:        Intake/Output Summary (Last 24 hours) at 11/26/2023 0835 Last data filed at 11/25/2023 1933 Gross per 24 hour  Intake 720 ml  Output --  Net 720 ml   Filed Weights   11/23/23 1018  Weight: 99.8 kg    Examination:  General exam: appears comfortable  Respiratory system: clear breath sounds b/l  Cardiovascular system: S1/S2+. No  rubs or clicks   Gastrointestinal system: Abd is soft, NT, ND & hypoactive bowel sounds Central nervous system: alert & oriented. Moves all extremities  Psychiatry: judgement and insight appears normal. Appropriate mood and affect    Data Reviewed: I have personally reviewed following labs and imaging studies  CBC: Recent Labs  Lab 11/23/23 1116 11/24/23 0449  WBC 6.1 5.4  NEUTROABS 3.2  --   HGB 12.5* 11.3*  HCT 38.9* 34.8*  MCV 90.0 89.7  PLT 299 267   Basic Metabolic Panel: Recent Labs  Lab 11/23/23 1116 11/24/23 0449 11/26/23 0340  NA 144 138 137  K 3.3* 4.1 4.4  CL 117* 103 103  CO2 17* 26 27  GLUCOSE 82 83 122*  BUN 20 25* 26*  CREATININE 1.27* 1.63* 1.67*  CALCIUM  7.1* 8.8* 8.6*   GFR: Estimated Creatinine Clearance: 45.3 mL/min (A) (by C-G formula based on SCr of 1.67 mg/dL (H)). Liver Function Tests: Recent Labs  Lab 11/23/23 1116  AST 14*  ALT <5  ALKPHOS 63  BILITOT 0.3  PROT 5.8*  ALBUMIN 3.1*   No results for input(s): LIPASE, AMYLASE in the last 168 hours. No results for input(s): AMMONIA in the last 168 hours. Coagulation Profile: Recent Labs  Lab 11/24/23 0449  INR 1.0   Cardiac Enzymes: No results for  input(s): CKTOTAL, CKMB, CKMBINDEX, TROPONINI in the last 168 hours. BNP (last 3 results) No results for input(s): PROBNP in the last 8760 hours. HbA1C: Recent Labs    11/23/23 1750  HGBA1C 6.1*   CBG: Recent Labs  Lab 11/25/23 1127 11/25/23 1134 11/25/23 1643 11/25/23 2018 11/26/23 0738  GLUCAP 142* 124* 109* 152* 121*   Lipid Profile: No results for input(s): CHOL, HDL, LDLCALC, TRIG, CHOLHDL, LDLDIRECT in the last 72 hours. Thyroid  Function Tests: No results for input(s): TSH, T4TOTAL, FREET4, T3FREE, THYROIDAB in the last 72 hours. Anemia Panel: No results for input(s): VITAMINB12, FOLATE, FERRITIN, TIBC, IRON, RETICCTPCT in the last 72 hours. Sepsis Labs: Recent  Labs  Lab 11/23/23 1117  LATICACIDVEN 1.2    Recent Results (from the past 240 hours)  Blood culture (routine x 2)     Status: Abnormal (Preliminary result)   Collection Time: 11/23/23  2:27 PM   Specimen: BLOOD  Result Value Ref Range Status   Specimen Description   Final    BLOOD RIGHT ANTECUBITAL Performed at [facility], [address], [place], [place] [address]    Special Requests   Final    BOTTLES DRAWN AEROBIC AND ANAEROBIC Blood Culture results may not be optimal due to an inadequate volume of blood received in culture bottles Performed at [facility], [address] [place], [place] [address]    Culture  Setup Time   Final    GRAM POSITIVE COCCI IN BOTH AEROBIC AND ANAEROBIC BOTTLES CRITICAL RESULT CALLED TO, READ BACK BY AND VERIFIED WITH: RAQUAL RODRIGUEZ PHARMD, [phone] 11/24/2023 [place] GRAM STAIN REVIEWED-AGREE WITH RESULT DRT Performed at [facility], [address], [place], [place] [address]    Culture STREPTOCOCCUS CRISTATUS (A)  Final   Report Status PENDING  Incomplete   Organism ID, Bacteria STREPTOCOCCUS CRISTATUS  Final      Susceptibility   Streptococcus cristatus - MIC*    PENICILLIN 0.25 INTERMEDIATE Intermediate     CEFTRIAXONE 1 SENSITIVE Sensitive     ERYTHROMYCIN <=0.12 SENSITIVE Sensitive     LEVOFLOXACIN 1 SENSITIVE Sensitive     VANCOMYCIN 0.5 SENSITIVE Sensitive     * STREPTOCOCCUS CRISTATUS  Blood Culture ID Panel (Reflexed)     Status: Abnormal   Collection Time: 11/23/23  2:27 PM  Result Value Ref Range Status   Enterococcus faecalis NOT DETECTED NOT DETECTED Final   Enterococcus Faecium NOT DETECTED NOT DETECTED Final   Listeria monocytogenes NOT DETECTED NOT DETECTED Final   Staphylococcus species NOT DETECTED NOT DETECTED Final   Staphylococcus aureus (BCID) NOT DETECTED NOT DETECTED Final   Staphylococcus epidermidis NOT DETECTED NOT DETECTED Final   Staphylococcus lugdunensis NOT DETECTED NOT DETECTED  Final   Streptococcus species DETECTED (A) NOT DETECTED Final    Comment: Not Enterococcus species, Streptococcus agalactiae, Streptococcus pyogenes, or Streptococcus pneumoniae. CRITICAL RESULT CALLED TO, READ BACK BY AND VERIFIED WITH: DELORSE STALLION PHARMD, [id number] 11/24/2023 KC    Streptococcus agalactiae NOT DETECTED NOT DETECTED Final   Streptococcus pneumoniae NOT DETECTED NOT DETECTED Final   Streptococcus pyogenes NOT DETECTED NOT DETECTED Final   A.calcoaceticus-baumannii NOT DETECTED NOT DETECTED Final   Bacteroides fragilis NOT DETECTED NOT DETECTED Final   Enterobacterales NOT DETECTED NOT DETECTED Final   Enterobacter cloacae complex NOT DETECTED NOT DETECTED Final   Escherichia coli NOT DETECTED NOT DETECTED Final   Klebsiella aerogenes NOT DETECTED NOT DETECTED Final   Klebsiella oxytoca NOT DETECTED NOT DETECTED Final   Klebsiella  pneumoniae NOT DETECTED NOT DETECTED Final   Proteus species NOT DETECTED NOT DETECTED Final   Salmonella species NOT DETECTED NOT DETECTED Final   Serratia marcescens NOT DETECTED NOT DETECTED Final   Haemophilus influenzae NOT DETECTED NOT DETECTED Final   Neisseria meningitidis NOT DETECTED NOT DETECTED Final   Pseudomonas aeruginosa NOT DETECTED NOT DETECTED Final   Stenotrophomonas maltophilia NOT DETECTED NOT DETECTED Final   Candida albicans NOT DETECTED NOT DETECTED Final   Candida auris NOT DETECTED NOT DETECTED Final   Candida glabrata NOT DETECTED NOT DETECTED Final   Candida krusei NOT DETECTED NOT DETECTED Final   Candida parapsilosis NOT DETECTED NOT DETECTED Final   Candida tropicalis NOT DETECTED NOT DETECTED Final   Cryptococcus neoformans/gattii NOT DETECTED NOT DETECTED Final    Comment: Performed at [facility], [address] [place], [place] [address]  Blood culture (routine x 2)     Status: None (Preliminary result)   Collection Time: 11/23/23  3:20 PM   Specimen: BLOOD  Result Value Ref Range Status    Specimen Description BLOOD LEFT ANTECUBITAL  Final   Special Requests   Final    BOTTLES DRAWN AEROBIC AND ANAEROBIC Blood Culture results may not be optimal due to an inadequate volume of blood received in culture bottles   Culture   Final    NO GROWTH 3 DAYS Performed at [facility], [address] [place], [place] [address]    Report Status PENDING  Incomplete  Fungus culture, blood     Status: None (Preliminary result)   Collection Time: 11/23/23  3:20 PM   Specimen: BLOOD  Result Value Ref Range Status   Specimen Description BLOOD BLOOD LEFT FOREARM  Final   Special Requests   Final    BOTTLES DRAWN AEROBIC AND ANAEROBIC Blood Culture results may not be optimal due to an inadequate volume of blood received in culture bottles Performed at [facility], [address] [place], [place] [address]    Culture PENDING  Incomplete   Report Status PENDING  Incomplete  Culture, blood (Routine X 2) w Reflex to ID Panel     Status: None (Preliminary result)   Collection Time: 11/24/23 10:19 AM   Specimen: BLOOD  Result Value Ref Range Status   Specimen Description BLOOD BLOOD RIGHT HAND  Final   Special Requests   Final    BOTTLES DRAWN AEROBIC AND ANAEROBIC Blood Culture adequate volume   Culture   Final    NO GROWTH 2 DAYS Performed at [facility], [address] [place], [place] [address]    Report Status PENDING  Incomplete  Culture, blood (Routine X 2) w Reflex to ID Panel     Status: None (Preliminary result)   Collection Time: 11/24/23 10:20 AM   Specimen: BLOOD  Result Value Ref Range Status   Specimen Description BLOOD BLOOD LEFT HAND  Final   Special Requests   Final    BOTTLES DRAWN AEROBIC AND ANAEROBIC Blood Culture adequate volume   Culture   Final    NO GROWTH 2 DAYS Performed at [facility], [address] [place], [place] [address]    Report Status PENDING  Incomplete  Aerobic/Anaerobic Culture w Gram Stain  (surgical/deep wound)     Status: None (Preliminary result)   Collection Time: 11/24/23  1:09 PM   Specimen: Fluid; Tissue  Result Value Ref Range Status   Specimen Description   Final    FLUID Performed at  [facility], [address] [place], [place] [address]    Special Requests   Final    [facility] Performed at [facility], [address] [place], [place] [address]    Gram Stain NO WBC SEEN NO ORGANISMS SEEN   Final   Culture   Final    NO GROWTH < 12 HOURS Performed at [facility], [address], [place], [place] [address]    Report Status PENDING  Incomplete         Radiology Studies: CT GUIDED NEEDLE PLACEMENT Result Date: 11/24/2023 INDICATION: Suspected L5-S1 discitis EXAM: CT-guided needle aspiration TECHNIQUE: Multidetector CT imaging of the lumbar region was performed following the standard protocol without IV contrast. RADIATION DOSE REDUCTION: This exam was performed according to the departmental dose-optimization program which includes automated exposure control, adjustment of the mA and/or kV according to patient size and/or use of iterative reconstruction technique. MEDICATIONS: The patient is currently admitted to the hospital and receiving intravenous antibiotics. The antibiotics were administered within an appropriate time frame prior to the initiation of the procedure. ANESTHESIA/SEDATION: Moderate (conscious) sedation was employed during this procedure. A total of Versed 1.5 mg and Fentanyl 75 mcg was administered intravenously by the radiology nurse. Total intra-service moderate Sedation Time: 12 minutes. The patient's level of consciousness and vital signs were monitored continuously by radiology nursing throughout the procedure under my direct supervision. COMPLICATIONS: None immediate. PROCEDURE: Informed written consent was obtained from the patient after a thorough discussion of the procedural risks, benefits and alternatives. All  questions were addressed. Maximal Sterile Barrier Technique was utilized including caps, mask, sterile gowns, sterile gloves, sterile drape, hand hygiene and skin antiseptic. A timeout was performed prior to the initiation of the procedure. With the patient in a prone position, radiopaque markers were placed on the lumbar spine. Axial images were obtained, evaluated, and reviewed. Measurements were obtained. Patient's skin was then marked, prepped, and draped in the usual sterile fashion. Local anesthesia was achieved by infiltrating 1% lidocaine into the subcutaneous tissue to the level of the L5-S1 disc space. An 18 gauge needle was then advanced through a small incision in the right paraspinal region. The needle was advanced and repeat axial imaging was performed to redirect the needle as necessary. When the needle was at the disc space, the stylet was removed and aspiration was performed. A small sample of fluid was then obtained and sent to pathology for culture and sensitivity. IMPRESSION: Successful CT-guided disc aspiration of L5-S1 Electronically Signed   By: Cordella Banner   On: 11/24/2023 16:35        Scheduled Meds:  amLODipine   10 mg Oral Daily   enoxaparin (LOVENOX) injection  40 mg Subcutaneous Q24H   ezetimibe   10 mg Oral Daily   furosemide   40 mg Oral Daily   insulin aspart  0-5 Units Subcutaneous QHS   insulin aspart  0-9 Units Subcutaneous TID WC   Continuous Infusions:  cefTRIAXone (ROCEPHIN)  IV 2 g (11/25/23 0956)     LOS: 2 days       Anthony CHRISTELLA Pouch, MD [facility] Pager [phone]  If 7PM-7AM, please contact night-coverage [email or web address] 11/26/2023, 8:35 AM

## 2023-11-26 NOTE — Progress Notes (Signed)
 Echocardiogram 2D Echocardiogram has been performed.  Thea Norlander 11/26/2023, 12:11 PM

## 2023-11-26 NOTE — Progress Notes (Signed)
 Peripherally Inserted Central Catheter Placement  The IV Nurse has discussed with the patient and/or persons authorized to consent for the patient, the purpose of this procedure and the potential benefits and risks involved with this procedure.  The benefits include less needle sticks, lab draws from the catheter, and the patient may be discharged home with the catheter. Risks include, but not limited to, infection, bleeding, blood clot (thrombus formation), and puncture of an artery; nerve damage and irregular heartbeat and possibility to perform a PICC exchange if needed/ordered by physician.  Alternatives to this procedure were also discussed.  Bard Power PICC patient education guide, fact sheet on infection prevention and patient information card has been provided to patient /or left at bedside.    PICC Placement Documentation  PICC Single Lumen 11/26/23 Right Brachial 43 cm 0 cm (Active)  Indication for Insertion or Continuance of Line Home intravenous therapies (PICC only) 11/26/23 2104  Exposed Catheter (cm) 0 cm 11/26/23 2104  Site Assessment Clean, Dry, Intact 11/26/23 2104  Line Status Blood return noted;Flushed;Saline locked 11/26/23 2104  Dressing Type Transparent;Securing device 11/26/23 2104  Dressing Status Antimicrobial disc/dressing in place 11/26/23 2104  Line Care Connections checked and tightened 11/26/23 2104  Line Adjustment (NICU/IV Team Only) No 11/26/23 2104  Dressing Intervention New dressing;Adhesive placed at insertion site (IV team only) 11/26/23 2104  Dressing Change Due 12/03/23 11/26/23 2104       Debby Salomon CROME 11/26/2023, 9:28 PM

## 2023-11-26 NOTE — Treatment Plan (Signed)
 Diagnosis: Streptococcus bacteremia Discitis Baseline Creatinine <1  Culture Result: Sterptococcus   Allergies  Allergen Reactions   Crestor  [Rosuvastatin  Calcium ] Other (See Comments)    Myalgias   Statins     Does not work well   Sulfonamide Derivatives Hives    OPAT Orders Discharge antibiotics: Ceftriaxone 2 grams IV every 24 hrs Duration: 6 weeks End Date:01/04/24   PIC Care Per Protocol:  Labs weekly while on IV antibiotics: _X_ CBC with differential __ BMP _X_ CMP _X_ CRP _X_ ESR   _X_ Please pull PIC at completion of IV antibiotics   Fax weekly lab results  promptly to [phone]  Clinic Follow Up Appt: 12/14/23 at 9.15 am   Call [phone] with critical value or questions

## 2023-11-27 DIAGNOSIS — M4647 Discitis, unspecified, lumbosacral region: Secondary | ICD-10-CM | POA: Diagnosis not present

## 2023-11-27 LAB — BASIC METABOLIC PANEL WITH GFR
Anion gap: 5 (ref 5–15)
BUN: 24 mg/dL — ABNORMAL HIGH (ref 8–23)
CO2: 27 mmol/L (ref 22–32)
Calcium: 8.5 mg/dL — ABNORMAL LOW (ref 8.9–10.3)
Chloride: 105 mmol/L (ref 98–111)
Creatinine, Ser: 1.47 mg/dL — ABNORMAL HIGH (ref 0.61–1.24)
GFR, Estimated: 49 mL/min — ABNORMAL LOW (ref >60)
Glucose, Bld: 116 mg/dL — ABNORMAL HIGH (ref 70–99)
Potassium: 4.4 mmol/L (ref 3.5–5.1)
Sodium: 137 mmol/L (ref 135–145)

## 2023-11-27 LAB — GLUCOSE, CAPILLARY
Glucose-Capillary: 100 mg/dL — ABNORMAL HIGH (ref 70–99)
Glucose-Capillary: 118 mg/dL — ABNORMAL HIGH (ref 70–99)

## 2023-11-27 MED ORDER — CEFTRIAXONE IV (FOR PTA / DISCHARGE USE ONLY): 2.0000 g | INTRAVENOUS | 0 refills | Status: AC

## 2023-11-27 NOTE — Plan of Care (Signed)
  Problem: Education: Goal: Knowledge of General Education information will improve Description: Including pain rating scale, medication(s)/side effects and non-pharmacologic comfort measures Outcome: Progressing   Problem: Health Behavior/Discharge Planning: Goal: Ability to manage health-related needs will improve Outcome: Progressing   Problem: Clinical Measurements: Goal: Will remain free from infection Outcome: Progressing   Problem: Clinical Measurements: Goal: Cardiovascular complication will be avoided Outcome: Progressing   Problem: Activity: Goal: Risk for activity intolerance will decrease Outcome: Progressing   Problem: Nutrition: Goal: Adequate nutrition will be maintained Outcome: Progressing   Problem: Elimination: Goal: Will not experience complications related to bowel motility Outcome: Progressing   Problem: Elimination: Goal: Will not experience complications related to urinary retention Outcome: Progressing   Problem: Pain Managment: Goal: General experience of comfort will improve and/or be controlled Outcome: Progressing

## 2023-11-27 NOTE — Discharge Summary (Signed)
 Physician Discharge Summary  Garrett Frank FMW:[id number] DOB: 1945/06/30 DOA: 11/23/2023  PCP: Frank Rock HERO, MD  Admit date: 11/23/2023 Discharge date: 11/27/2023  Admitted From: home Disposition:  home w/ home health   Recommendations for Outpatient Follow-up:  Follow up with PCP in 1-2 weeks F/u w/ ID, Dr. Fayette, 12/14/23 at 9.15 am   Home Health: yes Equipment/Devices: PICC  Discharge Condition: stable  CODE STATUS: full  Diet recommendation: Carb Modified   Brief/Interim Summary: HPI was taken from Dr. Roann: Garrett Frank is a pleasant 78 y.o. male with medical history significant for DM, HTN, gout who was sent over by PCP after he was found to have a MRI of the back indicating possible lumbar spine L5-S1 discitis versus abscess for evaluation.  Patient has intermittent back pain for the last 6 months which is worsened by physical activity and improved with rest.  Patient stated that pain is 6-7/10 in intensity gets worse when he moves around, does not radiate.  He denies any history of fall, trauma, fever, chills, nausea, vomiting, abdominal pain.   ED Course: Upon arrival to the ED, patient is found to be reassuring workup including normal white count, his ESR was 25 but patient has mild L5 tenderness.  Blood cultures were sent including fungal, C-reactive protein and ESR was ordered.  IR was consulted for possible biopsy of the targeted vertebra.  Infectious disease was contacted for assistance with the treatment.  Hospitalist service was consulted for evaluation for admission.  Discharge Diagnoses:  Principal Problem:   Low back pain Active Problems:   Mixed hyperlipidemia   Essential hypertension   Controlled type 2 diabetes mellitus without complication, without long-term current use of insulin (HCC)   Lower back pain   Bacteremia due to Streptococcus   Lumbar pain   Lumbar discitis  L5-S1 discitis versus osteomyelitis: continue on IV rocephin x 6 weeks as per  ID. Repeat blood cxs NGTD. S/p aspiration of L5-S1 disc and cx  is NGTD. No hardware, recent falls and/or trauma as per pt  Streptococcal bacteremia: blood cxs growing strept cristatus, sens pending. Continue on IV rocephin x 6 weeks until 12/23/5 as per ID. Echo did not show any vegetations.   HLD: continue on zetia   HTN: continue on amlodipine     DM2: well controlled. Restart home anti-DM meds at d/c   Gout: continue on home dose of colchicine    Ankle edema: continue on home dose of lasix    Discharge Instructions  Discharge Instructions     [facility] pharmacist to adjust dose for Vancomycin, Aminoglycosides and other anti-infective therapies as requested by physician.   Complete by: As directed    [facility] to provide Cath Flo 2mg    Complete by: As directed    Administer for PICC line occlusion and as ordered by physician for other access device issues.   Anaphylaxis Kit: Provided to treat any anaphylactic reaction to the medication being provided to the patient if First Dose or when requested by physician   Complete by: As directed    Epinephrine 1mg /ml vial / amp: Administer 0.3mg  (0.33ml) subcutaneously once for moderate to severe anaphylaxis, nurse to call physician and pharmacy when reaction occurs and call 911 if needed for immediate care   Diphenhydramine 50mg /ml IV vial: Administer 25-50mg  IV/IM PRN for first dose reaction, rash, itching, mild reaction, nurse to call physician and pharmacy when reaction occurs   Sodium Chloride 0.9% NS 500ml IV: Administer if needed for  hypovolemic blood pressure drop or as ordered by physician after call to physician with anaphylactic reaction   Change dressing on IV access line weekly and PRN   Complete by: As directed    Diet general   Complete by: As directed    Discharge instructions   Complete by: As directed    F/u w/ PCP in 1-2 weeks. F/u w/ ID, Dr. Fayette, 12/14/23 at 9.15 am.   Flush IV access with  Sodium Chloride 0.9% and Heparin 10 units/ml or 100 units/ml   Complete by: As directed    Home infusion instructions - [facility]   Complete by: As directed    Instructions: Flush IV access with Sodium Chloride 0.9% and Heparin 10units/ml or 100units/ml   Change dressing on IV access line: Weekly and PRN   Instructions Cath Flo 2mg : Administer for PICC Line occlusion and as ordered by physician for other access device   [facility] pharmacist to adjust dose for: Vancomycin, Aminoglycosides and other anti-infective therapies as requested by physician   Increase activity slowly   Complete by: As directed    Method of administration may be changed at the discretion of home infusion pharmacist based upon assessment of the patient and/or caregiver's ability to self-administer the medication ordered   Complete by: As directed    No wound care   Complete by: As directed       Allergies as of 11/27/2023       Reactions   Crestor  [rosuvastatin  Calcium ] Other (See Comments)   Myalgias   Statins    Does not work well   Sulfonamide Derivatives Hives        Medication List     TAKE these medications    amLODipine  10 MG tablet Commonly known as: NORVASC  Take 1 tablet (10 mg total) by mouth daily.   aspirin EC 81 MG tablet Take 81 mg by mouth daily.   cefTRIAXone IVPB Commonly known as: ROCEPHIN Inject 2 g into the vein daily. Indication:  Streptococcus bacteremia and discitis  First Dose: Yes Last Day of Therapy:  01/04/2024 Labs - Once weekly:  CBC/D and CMP, Labs - Once weekly: ESR and CRP Fax weekly lab results  promptly to [phone] Method of administration: IV Push Method of administration may be changed at the discretion of home infusion pharmacist based upon assessment of the patient and/or caregiver's ability to self-administer the medication ordered. X_ Please pull PIC at completion of IV antibiotics  Call [phone] with critical value or  questions   colchicine 0.6 MG tablet Take 0.6 mg by mouth daily. Take 2 tablets by mouth on 1st onset of GOUT flare, follow in 1 hour with 1 tablet (maximum 3 tablets in a hour)   docusate sodium 100 MG capsule Commonly known as: COLACE Take 100 mg by mouth daily as needed for mild constipation.   ezetimibe  10 MG tablet Commonly known as: ZETIA  TAKE 1 TABLET BY MOUTH EVERY DAY   furosemide  40 MG tablet Commonly known as: LASIX  1 tablet daily. What changed:  how much to take how to take this when to take this   hydrALAZINE  50 MG tablet Commonly known as: APRESOLINE  Take 50 mg by mouth 3 (three) times daily.   linagliptin 5 MG Tabs tablet Commonly known as: TRADJENTA Take 5 mg by mouth daily.   losartan 100 MG tablet Commonly known as: COZAAR Take 1 tablet by mouth daily.   Lumigan 0.01 % Soln Generic drug: bimatoprost Place  1 drop into both eyes at bedtime.   ONE TOUCH ULTRA TEST test strip Generic drug: glucose blood CHECK BLOOD SUGAR TWICE A DAY AS DIRECTED DX CODE: 250.02               Discharge Care Instructions  (From admission, onward)           Start     Ordered   11/27/23 0000  Change dressing on IV access line weekly and PRN  (Home infusion instructions - [facility] )        11/27/23 1330            Contact information for after-discharge care     Home Medical Care     [facility]) .   Service: Home Health Services Contact information: [address] [place]  [address] [phone]                    Allergies  Allergen Reactions   Crestor  [Rosuvastatin  Calcium ] Other (See Comments)    Myalgias   Statins     Does not work well   Sulfonamide Derivatives Hives    Consultations: ID IR   Procedures/Studies: MRI L spine WWO: IMPRESSION: 1. Abnormal L5-S1 level most compatible with infectious Discitis-Osteomyelitis. Severe reactive changes to  underlying degenerative disease is less likely (see #2). No spinal or paraspinal abscess. 2. Underlying Degenerative disease is Moderate to severe there and at most other lumbar levels. Subsequent degenerative moderate to severe multifactorial spinal, lateral recess, and left foraminal stenosis at L3-L4. Moderate spinal moderate to severe right lateral recess, and ight foraminal stenosis at L4-L5. Mild to moderate spinal stenosis at L2 L3. (Echo, Carotid, EGD, Colonoscopy, ERCP)    Subjective: Pt c/o fatigue    Discharge Exam: Vitals:   11/27/23 0500 11/27/23 0807  BP: 128/75 137/84  Pulse: (!) 52 73  Resp: 17 18  Temp: 98.3 F (36.8 C)   SpO2: 98% 99%   Vitals:   11/26/23 1700 11/26/23 1951 11/27/23 0500 11/27/23 0807  BP: (!) 140/75 131/80 128/75 137/84  Pulse:  75 (!) 52 73  Resp:  17 17 18   Temp:  98.7 F (37.1 C) 98.3 F (36.8 C)   TempSrc:      SpO2:  98% 98% 99%  Weight:      Height:        General: Pt is alert, awake, not in acute distress Cardiovascular: S1/S2 +, no rubs, no gallops Respiratory: CTA bilaterally, no wheezing, no rhonchi Abdominal: Soft, NT, obese, bowel sounds + Extremities: no cyanosis    The results of significant diagnostics from this hospitalization (including imaging, microbiology, ancillary and laboratory) are listed below for reference.     Microbiology: Recent Results (from the past 240 hours)  Blood culture (routine x 2)     Status: Abnormal   Collection Time: 11/23/23  2:27 PM   Specimen: BLOOD  Result Value Ref Range Status   Specimen Description   Final    BLOOD RIGHT ANTECUBITAL Performed at [facility] [place], [address] [place], [place] [address]    Special Requests   Final    BOTTLES DRAWN AEROBIC AND ANAEROBIC Blood Culture results may not be optimal due to an inadequate volume of blood received in culture bottles Performed at [facility], [address], [place], [place] [address]    Culture   Setup Time   Final    [facility] POSITIVE COCCI  IN BOTH AEROBIC AND ANAEROBIC BOTTLES CRITICAL RESULT CALLED TO, READ BACK BY AND VERIFIED WITH: RAQUAL RODRIGUEZ PHARMD, 9192 11/24/2023 [place] GRAM STAIN REVIEWED-AGREE WITH RESULT DRT    Culture (A)  Final    STREPTOCOCCUS CRISTATUS GRANULICATELLA ADIACENS Standardized susceptibility testing for this organism is not available. Performed at [facility], [address] [place], [place] [address]    Report Status 11/26/2023 FINAL  Final   Organism ID, Bacteria STREPTOCOCCUS CRISTATUS  Final      Susceptibility   Streptococcus cristatus - MIC*    PENICILLIN 0.25 INTERMEDIATE Intermediate     CEFTRIAXONE 1 SENSITIVE Sensitive     ERYTHROMYCIN <=0.12 SENSITIVE Sensitive     LEVOFLOXACIN 1 SENSITIVE Sensitive     VANCOMYCIN 0.5 SENSITIVE Sensitive     * STREPTOCOCCUS CRISTATUS  Blood Culture ID Panel (Reflexed)     Status: Abnormal   Collection Time: 11/23/23  2:27 PM  Result Value Ref Range Status   Enterococcus faecalis NOT DETECTED NOT DETECTED Final   Enterococcus Faecium NOT DETECTED NOT DETECTED Final   Listeria monocytogenes NOT DETECTED NOT DETECTED Final   Staphylococcus species NOT DETECTED NOT DETECTED Final   Staphylococcus aureus (BCID) NOT DETECTED NOT DETECTED Final   Staphylococcus epidermidis NOT DETECTED NOT DETECTED Final   Staphylococcus lugdunensis NOT DETECTED NOT DETECTED Final   Streptococcus species DETECTED (A) NOT DETECTED Final    Comment: Not Enterococcus species, Streptococcus agalactiae, Streptococcus pyogenes, or Streptococcus pneumoniae. CRITICAL RESULT CALLED TO, READ BACK BY AND VERIFIED WITH: DELORSE STALLION PHARMD, 9192 11/24/2023 KC    Streptococcus agalactiae NOT DETECTED NOT DETECTED Final   Streptococcus pneumoniae NOT DETECTED NOT DETECTED Final   Streptococcus pyogenes NOT DETECTED NOT DETECTED Final   A.calcoaceticus-baumannii NOT DETECTED NOT DETECTED Final   Bacteroides fragilis NOT  DETECTED NOT DETECTED Final   Enterobacterales NOT DETECTED NOT DETECTED Final   Enterobacter cloacae complex NOT DETECTED NOT DETECTED Final   Escherichia coli NOT DETECTED NOT DETECTED Final   Klebsiella aerogenes NOT DETECTED NOT DETECTED Final   Klebsiella oxytoca NOT DETECTED NOT DETECTED Final   Klebsiella pneumoniae NOT DETECTED NOT DETECTED Final   Proteus species NOT DETECTED NOT DETECTED Final   Salmonella species NOT DETECTED NOT DETECTED Final   Serratia marcescens NOT DETECTED NOT DETECTED Final   Haemophilus influenzae NOT DETECTED NOT DETECTED Final   Neisseria meningitidis NOT DETECTED NOT DETECTED Final   Pseudomonas aeruginosa NOT DETECTED NOT DETECTED Final   Stenotrophomonas maltophilia NOT DETECTED NOT DETECTED Final   Candida albicans NOT DETECTED NOT DETECTED Final   Candida auris NOT DETECTED NOT DETECTED Final   Candida glabrata NOT DETECTED NOT DETECTED Final   Candida krusei NOT DETECTED NOT DETECTED Final   Candida parapsilosis NOT DETECTED NOT DETECTED Final   Candida tropicalis NOT DETECTED NOT DETECTED Final   Cryptococcus neoformans/gattii NOT DETECTED NOT DETECTED Final    Comment: Performed at [facility], [address] [place], [place] [address]  Blood culture (routine x 2)     Status: None (Preliminary result)   Collection Time: 11/23/23  3:20 PM   Specimen: BLOOD  Result Value Ref Range Status   Specimen Description BLOOD LEFT ANTECUBITAL  Final   Special Requests   Final    BOTTLES DRAWN AEROBIC AND ANAEROBIC Blood Culture results may not be optimal due to an inadequate volume of blood received in culture bottles   Culture   Final    NO GROWTH 4 DAYS Performed at  [facility], [address], [place], [place] [address]    Report Status PENDING  Incomplete  Fungus culture, blood     Status: None (Preliminary result)   Collection Time: 11/23/23  3:20 PM   Specimen: BLOOD  Result Value Ref Range Status   Specimen  Description BLOOD BLOOD LEFT FOREARM  Final   Special Requests   Final    BOTTLES DRAWN AEROBIC AND ANAEROBIC Blood Culture results may not be optimal due to an inadequate volume of blood received in culture bottles Performed at [facility], [address] [place], [place] [address]    Culture PENDING  Incomplete   Report Status PENDING  Incomplete  Culture, blood (Routine X 2) w Reflex to ID Panel     Status: None (Preliminary result)   Collection Time: 11/24/23 10:19 AM   Specimen: BLOOD  Result Value Ref Range Status   Specimen Description BLOOD BLOOD RIGHT HAND  Final   Special Requests   Final    BOTTLES DRAWN AEROBIC AND ANAEROBIC Blood Culture adequate volume   Culture   Final    NO GROWTH 3 DAYS Performed at [facility], [address] [place], [place] [address]    Report Status PENDING  Incomplete  Culture, blood (Routine X 2) w Reflex to ID Panel     Status: None (Preliminary result)   Collection Time: 11/24/23 10:20 AM   Specimen: BLOOD  Result Value Ref Range Status   Specimen Description BLOOD BLOOD LEFT HAND  Final   Special Requests   Final    BOTTLES DRAWN AEROBIC AND ANAEROBIC Blood Culture adequate volume   Culture   Final    NO GROWTH 3 DAYS Performed at [facility], [address] [place], [place] [address]    Report Status PENDING  Incomplete  Aerobic/Anaerobic Culture w Gram Stain (surgical/deep wound)     Status: None (Preliminary result)   Collection Time: 11/24/23  1:09 PM   Specimen: Fluid; Tissue  Result Value Ref Range Status   Specimen Description   Final    FLUID Performed at [facility], [address], [place], [place] [address]    Special Requests   Final    [facility] Performed at [facility], [address] [place], [place] [address]    Gram Stain NO WBC SEEN NO ORGANISMS SEEN   Final   Culture   Final    NO GROWTH 3 DAYS NO ANAEROBES ISOLATED; CULTURE IN PROGRESS FOR 5  DAYS Performed at [facility], [address] [place], [place] [address]    Report Status PENDING  Incomplete     Labs: BNP (last 3 results) No results for input(s): BNP in the last 8760 hours. Basic Metabolic Panel: Recent Labs  Lab 11/23/23 1116 11/24/23 0449 11/26/23 0340 11/27/23 0558  NA 144 138 137 137  K 3.3* 4.1 4.4 4.4  CL 117* 103 103 105  CO2 17* 26 27 27   GLUCOSE 82 83 122* 116*  BUN 20 25* 26* 24*  CREATININE 1.27* 1.63* 1.67* 1.47*  CALCIUM  7.1* 8.8* 8.6* 8.5*   Liver Function Tests: Recent Labs  Lab 11/23/23 1116  AST 14*  ALT <5  ALKPHOS 63  BILITOT 0.3  PROT 5.8*  ALBUMIN 3.1*   No results for input(s): LIPASE, AMYLASE in the last 168 hours. No results for input(s): AMMONIA in the last 168 hours. CBC: Recent Labs  Lab 11/23/23 1116 11/24/23 0449  WBC 6.1  5.4  NEUTROABS 3.2  --   HGB 12.5* 11.3*  HCT 38.9* 34.8*  MCV 90.0 89.7  PLT 299 267   Cardiac Enzymes: No results for input(s): CKTOTAL, CKMB, CKMBINDEX, TROPONINI in the last 168 hours. BNP: Invalid input(s): POCBNP CBG: Recent Labs  Lab 11/26/23 1137 11/26/23 1703 11/26/23 1954 11/27/23 0806 11/27/23 1152  GLUCAP 88 169* 107* 100* 118*   D-Dimer No results for input(s): DDIMER in the last 72 hours. Hgb A1c No results for input(s): HGBA1C in the last 72 hours. Lipid Profile No results for input(s): CHOL, HDL, LDLCALC, TRIG, CHOLHDL, LDLDIRECT in the last 72 hours. Thyroid  function studies No results for input(s): TSH, T4TOTAL, T3FREE, THYROIDAB in the last 72 hours.  Invalid input(s): FREET3 Anemia work up No results for input(s): VITAMINB12, FOLATE, FERRITIN, TIBC, IRON, RETICCTPCT in the last 72 hours. Urinalysis No results found for: COLORURINE, APPEARANCEUR, LABSPEC, PHURINE, GLUCOSEU, HGBUR, BILIRUBINUR, KETONESUR, PROTEINUR, UROBILINOGEN, NITRITE, LEUKOCYTESUR Sepsis  Labs Recent Labs  Lab 11/23/23 1116 11/24/23 0449  WBC 6.1 5.4   Microbiology Recent Results (from the past 240 hours)  Blood culture (routine x 2)     Status: Abnormal   Collection Time: 11/23/23  2:27 PM   Specimen: BLOOD  Result Value Ref Range Status   Specimen Description   Final    BLOOD RIGHT ANTECUBITAL Performed at [facility], [address] [place], [place] [address]    Special Requests   Final    BOTTLES DRAWN AEROBIC AND ANAEROBIC Blood Culture results may not be optimal due to an inadequate volume of blood received in culture bottles Performed at [facility], [address] [place], [place] [address]    Culture  Setup Time   Final    GRAM POSITIVE COCCI IN BOTH AEROBIC AND ANAEROBIC BOTTLES CRITICAL RESULT CALLED TO, READ BACK BY AND VERIFIED WITH: RAQUAL RODRIGUEZ PHARMD, 0807 11/24/2023 KC GRAM STAIN REVIEWED-AGREE WITH RESULT DRT    Culture (A)  Final    STREPTOCOCCUS CRISTATUS GRANULICATELLA ADIACENS Standardized susceptibility testing for this organism is not available. Performed at [facility], [address] [place], [place] [address]    Report Status 11/26/2023 FINAL  Final   Organism ID, Bacteria STREPTOCOCCUS CRISTATUS  Final      Susceptibility   Streptococcus cristatus - MIC*    PENICILLIN 0.25 INTERMEDIATE Intermediate     CEFTRIAXONE 1 SENSITIVE Sensitive     ERYTHROMYCIN <=0.12 SENSITIVE Sensitive     LEVOFLOXACIN 1 SENSITIVE Sensitive     VANCOMYCIN 0.5 SENSITIVE Sensitive     * STREPTOCOCCUS CRISTATUS  Blood Culture ID Panel (Reflexed)     Status: Abnormal   Collection Time: 11/23/23  2:27 PM  Result Value Ref Range Status   Enterococcus faecalis NOT DETECTED NOT DETECTED Final   Enterococcus Faecium NOT DETECTED NOT DETECTED Final   Listeria monocytogenes NOT DETECTED NOT DETECTED Final   Staphylococcus species NOT DETECTED NOT DETECTED Final   Staphylococcus aureus (BCID) NOT DETECTED NOT DETECTED Final    Staphylococcus epidermidis NOT DETECTED NOT DETECTED Final   Staphylococcus lugdunensis NOT DETECTED NOT DETECTED Final   Streptococcus species DETECTED (A) NOT DETECTED Final    Comment: Not Enterococcus species, Streptococcus agalactiae, Streptococcus pyogenes, or Streptococcus pneumoniae. CRITICAL RESULT CALLED TO, READ BACK BY AND VERIFIED WITH: RAQUAL RODRIGUEZ PHARMD, 9192 11/24/2023 KC    Streptococcus agalactiae NOT DETECTED NOT DETECTED Final   Streptococcus pneumoniae NOT DETECTED NOT DETECTED Final   Streptococcus pyogenes NOT DETECTED  NOT DETECTED Final   A.calcoaceticus-baumannii NOT DETECTED NOT DETECTED Final   Bacteroides fragilis NOT DETECTED NOT DETECTED Final   Enterobacterales NOT DETECTED NOT DETECTED Final   Enterobacter cloacae complex NOT DETECTED NOT DETECTED Final   Escherichia coli NOT DETECTED NOT DETECTED Final   Klebsiella aerogenes NOT DETECTED NOT DETECTED Final   Klebsiella oxytoca NOT DETECTED NOT DETECTED Final   Klebsiella pneumoniae NOT DETECTED NOT DETECTED Final   Proteus species NOT DETECTED NOT DETECTED Final   Salmonella species NOT DETECTED NOT DETECTED Final   Serratia marcescens NOT DETECTED NOT DETECTED Final   Haemophilus influenzae NOT DETECTED NOT DETECTED Final   Neisseria meningitidis NOT DETECTED NOT DETECTED Final   Pseudomonas aeruginosa NOT DETECTED NOT DETECTED Final   Stenotrophomonas maltophilia NOT DETECTED NOT DETECTED Final   Candida albicans NOT DETECTED NOT DETECTED Final   Candida auris NOT DETECTED NOT DETECTED Final   Candida glabrata NOT DETECTED NOT DETECTED Final   Candida krusei NOT DETECTED NOT DETECTED Final   Candida parapsilosis NOT DETECTED NOT DETECTED Final   Candida tropicalis NOT DETECTED NOT DETECTED Final   Cryptococcus neoformans/gattii NOT DETECTED NOT DETECTED Final    Comment: Performed at [facility], [address] [place], [place] [address]  Blood culture (routine x 2)     Status:  None (Preliminary result)   Collection Time: 11/23/23  3:20 PM   Specimen: BLOOD  Result Value Ref Range Status   Specimen Description BLOOD LEFT ANTECUBITAL  Final   Special Requests   Final    BOTTLES DRAWN AEROBIC AND ANAEROBIC Blood Culture results may not be optimal due to an inadequate volume of blood received in culture bottles   Culture   Final    NO GROWTH 4 DAYS Performed at [facility], [address] [place], [place] [address]    Report Status PENDING  Incomplete  Fungus culture, blood     Status: None (Preliminary result)   Collection Time: 11/23/23  3:20 PM   Specimen: BLOOD  Result Value Ref Range Status   Specimen Description BLOOD BLOOD LEFT FOREARM  Final   Special Requests   Final    BOTTLES DRAWN AEROBIC AND ANAEROBIC Blood Culture results may not be optimal due to an inadequate volume of blood received in culture bottles Performed at [facility], [address] [place], [place] [address]    Culture PENDING  Incomplete   Report Status PENDING  Incomplete  Culture, blood (Routine X 2) w Reflex to ID Panel     Status: None (Preliminary result)   Collection Time: 11/24/23 10:19 AM   Specimen: BLOOD  Result Value Ref Range Status   Specimen Description BLOOD BLOOD RIGHT HAND  Final   Special Requests   Final    BOTTLES DRAWN AEROBIC AND ANAEROBIC Blood Culture adequate volume   Culture   Final    NO GROWTH 3 DAYS Performed at [facility], [address] [place], [place] [address]    Report Status PENDING  Incomplete  Culture, blood (Routine X 2) w Reflex to ID Panel     Status: None (Preliminary result)   Collection Time: 11/24/23 10:20 AM   Specimen: BLOOD  Result Value Ref Range Status   Specimen Description BLOOD BLOOD LEFT HAND  Final   Special Requests   Final    BOTTLES DRAWN AEROBIC AND ANAEROBIC Blood Culture adequate volume   Culture   Final    NO GROWTH 3 DAYS Performed  at [facility], [address] [place], [place] [address]    Report Status PENDING  Incomplete  Aerobic/Anaerobic Culture w Gram Stain (surgical/deep wound)     Status: None (Preliminary result)   Collection Time: 11/24/23  1:09 PM   Specimen: Fluid; Tissue  Result Value Ref Range Status   Specimen Description   Final    FLUID Performed at [facility], [address] [place], [place] [address]    Special Requests   Final    [facility] Performed at [facility], [address] [place], [place] [address]    Gram Stain NO WBC SEEN NO ORGANISMS SEEN   Final   Culture   Final    NO GROWTH 3 DAYS NO ANAEROBES ISOLATED; CULTURE IN PROGRESS FOR 5 DAYS Performed at [facility], [address] [place], [place] [address]    Report Status PENDING  Incomplete     Time coordinating discharge:  37 minutes  SIGNED:   Anthony CHRISTELLA Pouch, MD  [facility] 11/27/2023, 1:33 PM Pager   If 7PM-7AM, please contact night-coverage [email or web address]

## 2023-11-27 NOTE — TOC Progression Note (Addendum)
 Transition of Care [facility]) - Progression Note    Patient Details  Name: Garrett Frank MRN: [id number] Date of Birth: 01/10/1946  Transition [facility]) CM/SW Contact  Alvia Olam Fabry, RN Phone Number: 11/27/2023, 11:35 AM  Clinical Narrative:      Pt will discharge today with [facility] and [facility] who will start services on Sunday.  All services have been verified by both party.  No other needs presented at this time.   RNCM spoke with pt with update and he has indicated Lorenza his daughter will transport pt home who lives with him and will assistance when needed. Pt aware [facility] will service pt on Sunday. Pt uses [facility] and has no DME. No other needs presented at this time.  1:00 pm RNCM again contacted both [facility] and [facility] to confirm all services will start tomorrow with the initial home visit.   Pam ([facility]) indicated the daughter is aware of the pre-filled IV medication that will be pushed over 10 minutes for delivery time and capable of the process. Again daughter will be taught on this process tomorrow with [place] services.   No additional needs presented.                     Expected Discharge Plan and Services                                               Social Drivers of Health (SDOH) Interventions SDOH Screenings   Food Insecurity: No Food Insecurity (11/23/2023)  Housing: Low Risk  (11/23/2023)  Transportation Needs: No Transportation Needs (11/23/2023)  Utilities: Not At Risk (11/23/2023)  Social Connections: Moderately Integrated (11/23/2023)  Tobacco Use: Medium Risk (11/23/2023)    Readmission Risk Interventions     No data to display

## 2023-11-28 LAB — CULTURE, BLOOD (ROUTINE X 2): Culture: NO GROWTH

## 2023-11-29 LAB — CULTURE, BLOOD (ROUTINE X 2)
Culture: NO GROWTH
Culture: NO GROWTH
Special Requests: ADEQUATE
Special Requests: ADEQUATE

## 2023-11-29 LAB — AEROBIC/ANAEROBIC CULTURE W GRAM STAIN (SURGICAL/DEEP WOUND)
Culture: NO GROWTH
Gram Stain: NONE SEEN

## 2023-12-14 ENCOUNTER — Encounter: Payer: Self-pay | Admitting: Infectious Diseases

## 2023-12-14 ENCOUNTER — Ambulatory Visit: Attending: Infectious Diseases | Admitting: Infectious Diseases

## 2023-12-14 VITALS — BP 141/81 | HR 90 | Temp 99.0°F | Ht 73.0 in | Wt 249.0 lb

## 2023-12-14 DIAGNOSIS — M4646 Discitis, unspecified, lumbar region: Secondary | ICD-10-CM | POA: Insufficient documentation

## 2023-12-14 DIAGNOSIS — Z87891 Personal history of nicotine dependence: Secondary | ICD-10-CM | POA: Insufficient documentation

## 2023-12-14 DIAGNOSIS — Z79899 Other long term (current) drug therapy: Secondary | ICD-10-CM | POA: Insufficient documentation

## 2023-12-14 DIAGNOSIS — E119 Type 2 diabetes mellitus without complications: Secondary | ICD-10-CM | POA: Diagnosis not present

## 2023-12-14 DIAGNOSIS — B954 Other streptococcus as the cause of diseases classified elsewhere: Secondary | ICD-10-CM

## 2023-12-14 DIAGNOSIS — R7989 Other specified abnormal findings of blood chemistry: Secondary | ICD-10-CM | POA: Insufficient documentation

## 2023-12-14 DIAGNOSIS — R7881 Bacteremia: Secondary | ICD-10-CM | POA: Insufficient documentation

## 2023-12-14 DIAGNOSIS — M4626 Osteomyelitis of vertebra, lumbar region: Secondary | ICD-10-CM | POA: Diagnosis not present

## 2023-12-14 DIAGNOSIS — I129 Hypertensive chronic kidney disease with stage 1 through stage 4 chronic kidney disease, or unspecified chronic kidney disease: Secondary | ICD-10-CM | POA: Insufficient documentation

## 2023-12-14 DIAGNOSIS — E1122 Type 2 diabetes mellitus with diabetic chronic kidney disease: Secondary | ICD-10-CM | POA: Insufficient documentation

## 2023-12-14 DIAGNOSIS — B955 Unspecified streptococcus as the cause of diseases classified elsewhere: Secondary | ICD-10-CM | POA: Insufficient documentation

## 2023-12-14 DIAGNOSIS — Z7982 Long term (current) use of aspirin: Secondary | ICD-10-CM | POA: Insufficient documentation

## 2023-12-14 DIAGNOSIS — N189 Chronic kidney disease, unspecified: Secondary | ICD-10-CM | POA: Insufficient documentation

## 2023-12-14 NOTE — Progress Notes (Signed)
 NAME: Garrett Frank  DOB: November 17, 1945  MRN: [id number]  Date/Time: 12/14/2023 9:50 AM   Subjective:   ? pt agreed to the use of AI scribe Here with his daughter Garrett Frank is a 78 year old male with a lumbar spine infection who presents for follow-up on IV ceftriaxone  treatment. He is accompanied by his daughter, who is administering his IV antibiotics.  He was previously hospitalized for back pain, during which a blood culture revealed a bacterial infection. There was a concern for an infection at the lumbar spine L5-S1. A needle aspiration was performed, but no bacteria were cultured from the site. He was discharged with a PICC line for IV ceftriaxone , administered once daily by his daughter.  He experiences urinary urgency and incontinence, often wetting himself before reaching the bathroom. He takes losartan and amlodipine  for blood pressure, and Lasix  as a diuretic.  Since the insertion of the PICC line on November 11th, he has experienced numbness and decreased sensation in his fingers, particularly affecting fine motor skills such as buttoning his shirt and picking up small objects. This issue began after the PICC line was placed and has remained consistent since then. No pain is associated with the numbness. He denies any tingling or numbness in his legs and reports no issues with his toes.  He has a history of diabetes, which he manages with Tradjenta. His blood sugar levels are stable. He also takes Zetia  for cholesterol and a baby aspirin. He mentions a slight increase in creatinine levels compared to his hospital stay.  His back pain has improved since the initial hospitalization. No diarrhea, neck pain, or leg numbness. He reports a slight throbbing sensation at the PICC line site when lying down but no significant pain.  Past Medical History:  Diagnosis Date   Diabetes mellitus    Gout    HTN (hypertension)    Morbid obesity (HCC)    Nephrolithiasis     Past Surgical  History:  Procedure Laterality Date   CHOLECYSTECTOMY      Social History   Socioeconomic History   Marital status: Widowed    Spouse name: Not on file   Number of children: Not on file   Years of education: Not on file   Highest education level: Not on file  Occupational History   Not on file  Tobacco Use   Smoking status: Former    Current packs/day: 0.50    Average packs/day: 0.5 packs/day for 15.0 years (7.5 ttl pk-yrs)    Types: Cigarettes   Smokeless tobacco: Never  Vaping Use   Vaping status: Never Used  Substance and Sexual Activity   Alcohol use: Yes    Comment: ocassionally   Drug use: No   Sexual activity: Not on file  Other Topics Concern   Not on file  Social History Narrative   Not on file   Social Drivers of Health   Financial Resource Strain: Not on file  Food Insecurity: No Food Insecurity (11/23/2023)   Hunger Vital Sign    Worried About Running Out of Food in the Last Year: Never true    Ran Out of Food in the Last Year: Never true  Transportation Needs: No Transportation Needs (11/23/2023)   PRAPARE - Administrator, Civil Service (Medical): No    Lack of Transportation (Non-Medical): No  Physical Activity: Not on file  Stress: Not on file  Social Connections: Moderately Integrated (11/23/2023)   Social Connection and Isolation Panel  Frequency of Communication with Friends and Family: More than three times a week    Frequency of Social Gatherings with Friends and Family: More than three times a week    Attends Religious Services: 1 to 4 times per year    Active Member of [employer] or Organizations: No    Attends Engineer, Structural: More than 4 times per year    Marital Status: Separated  Intimate Partner Violence: Not At Risk (11/23/2023)   Humiliation, Afraid, Rape, and Kick questionnaire    Fear of Current or Ex-Partner: No    Emotionally Abused: No    Physically Abused: No    Sexually Abused: No    Family History   Problem Relation Age of Onset   Aneurysm Mother 24   Hypertension Father    Kidney failure Father    Coronary artery disease Neg Hx    Allergies  Allergen Reactions   Crestor  [Rosuvastatin  Calcium ] Other (See Comments)    Myalgias   Statins     Does not work well   Sulfonamide Derivatives Hives   I? Current Outpatient Medications  Medication Sig Dispense Refill   amLODipine  (NORVASC ) 10 MG tablet Take 1 tablet (10 mg total) by mouth daily. 90 tablet 3   aspirin 81 MG EC tablet Take 81 mg by mouth daily.     cefTRIAXone  (ROCEPHIN ) IVPB Inject 2 g into the vein daily. Indication:  Streptococcus bacteremia and discitis  First Dose: Yes Last Day of Therapy:  01/04/2024 Labs - Once weekly:  CBC/D and CMP, Labs - Once weekly: ESR and CRP Fax weekly lab results  promptly to [phone] Method of administration: IV Push Method of administration may be changed at the discretion of home infusion pharmacist based upon assessment of the patient and/or caregiver's ability to self-administer the medication ordered. X_ Please pull PIC at completion of IV antibiotics  Call [phone] with critical value or questions 39 Units 0   colchicine  0.6 MG tablet Take 0.6 mg by mouth daily. Take 2 tablets by mouth on 1st onset of GOUT flare, follow in 1 hour with 1 tablet (maximum 3 tablets in a hour)     docusate sodium (COLACE) 100 MG capsule Take 100 mg by mouth daily as needed for mild constipation.     ezetimibe  (ZETIA ) 10 MG tablet TAKE 1 TABLET BY MOUTH EVERY DAY 90 tablet 2   furosemide  (LASIX ) 40 MG tablet 1 tablet daily. (Patient taking differently: Take 40 mg by mouth daily. 1 tablet daily.) 30 tablet 6   hydrALAZINE  (APRESOLINE ) 50 MG tablet Take 50 mg by mouth 3 (three) times daily.     linagliptin (TRADJENTA) 5 MG TABS tablet Take 5 mg by mouth daily.     losartan (COZAAR) 100 MG tablet Take 1 tablet by mouth daily.     LUMIGAN 0.01 % SOLN Place 1 drop into both eyes at bedtime.      ONE TOUCH ULTRA TEST test strip CHECK BLOOD SUGAR TWICE A DAY AS DIRECTED DX CODE: 250.02  11   No current facility-administered medications for this visit.     Abtx:  Anti-infectives (From admission, onward)    None       REVIEW OF SYSTEMS:  Const: negative fever, negative chills, negative weight loss Eyes: negative diplopia or visual changes, negative eye pain ENT: negative coryza, negative sore throat Resp: negative cough, hemoptysis, dyspnea Cards: negative for chest pain, palpitations, lower extremity edema GU: negative for frequency, dysuria and hematuria GI:  Negative for abdominal pain, diarrhea, bleeding, constipation Skin: negative for rash and pruritus Heme: negative for easy bruising and gum/nose bleeding MS: negative for myalgias, arthralgias, back pain and muscle weakness Neurolo:negative for headaches, dizziness, vertigo, memory problems  Psych: negative for feelings of anxiety, depression  Endocrine: negative for thyroid , diabetes Allergy/Immunology- negative for any medication or food allergies ? Pertinent Positives include : Objective:  VITALS:  BP (!) 141/81   Pulse 90   Temp 99 F (37.2 C) (Oral)   Ht 6' 1 (1.854 m)   Wt 249 lb (112.9 kg)   SpO2 96%   BMI 32.85 kg/m  LDA Foley Central line Other drainage tubes PHYSICAL EXAM:  General: Alert, cooperative, no distress, appears stated age.  Head: Normocephalic, without obvious abnormality, atraumatic. Eyes: Conjunctivae clear, anicteric sclerae. Pupils are equal ENT Nares normal. No drainage or sinus tenderness. Lips, mucosa, and tongue normal. No Thrush Neck: Supple, symmetrical, no adenopathy, thyroid : non tender no carotid bruit and no JVD. Back: No CVA tenderness. Lungs: Clear to auscultation bilaterally. No Wheezing or Rhonchi. No rales. Heart: Regular rate and rhythm, no murmur, rub or gallop. Abdomen: Soft, non-tender,not distended. Bowel sounds normal. No masses Extremities:  atraumatic, no cyanosis. No edema. No clubbing Skin: No rashes or lesions. Or bruising Lymph: Cervical, supraclavicular normal. Neurologic: fine motor weakness of rt index and thumb- unable to pick pencil with rt  Left he can Pertinent Labs Lab Results CBC    Component Value Date/Time   WBC 5.4 11/24/2023 0449   RBC 3.88 (L) 11/24/2023 0449   HGB 11.3 (L) 11/24/2023 0449   HCT 34.8 (L) 11/24/2023 0449   PLT 267 11/24/2023 0449   MCV 89.7 11/24/2023 0449   MCH 29.1 11/24/2023 0449   MCHC 32.5 11/24/2023 0449   RDW 13.0 11/24/2023 0449   LYMPHSABS 2.3 11/23/2023 1116   MONOABS 0.5 11/23/2023 1116   EOSABS 0.1 11/23/2023 1116   BASOSABS 0.1 11/23/2023 1116       Latest Ref Rng & Units 11/27/2023    5:58 AM 11/26/2023    3:40 AM 11/24/2023    4:49 AM  CMP  Glucose 70 - 99 mg/dL 883  877  83   BUN 8 - 23 mg/dL 24  26  25    Creatinine 0.61 - 1.24 mg/dL 8.52  8.32  8.36   Sodium 135 - 145 mmol/L 137  137  138   Potassium 3.5 - 5.1 mmol/L 4.4  4.4  4.1   Chloride 98 - 111 mmol/L 105  103  103   CO2 22 - 32 mmol/L 27  27  26    Calcium  8.9 - 10.3 mg/dL 8.5  8.6  8.8    ? 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 No bacterial growth from lumbar spine aspiration. Back pain has improved. Continues to receive IV antibiotics at home with assistance from family. 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 Symptoms include numbness and difficulty with fine motor movements, particularly in the thumb. Symptoms began after PICC  line insertion on November 11th, 2025. Differential includes PICC line irritation or antibiotic side effects. No pain reported. Symptoms  are worsening with time. - Consulted vascular team regarding PICC line placement and potential nerve irritation - Will consider replacing PICC line with a smaller line if symptoms persist - Start vitamin B12 supplementation  Type 2 diabetes mellitus, controlled Type 2 diabetes is well-controlled with current medication regimen. Blood sugar levels are within acceptable range. - Continue current diabetes management regimen  Chronic kidney disease, unspecified Chronic kidney disease with recent increase in creatinine levels from 1.67 to 1.73, indicating a slight decline in kidney function. - Encouraged increased water intake to support kidney function  Hypertension, controlled Hypertension is well-controlled with current medication regimen. - Continue current antihypertensive medications? ? ________________________________________________ Discussed with patient,and daughter  PICC team got backto me- his line is in brachial vein and could be irritating the nerve- Pt will be brought to the day surgery to remove it and place a midline ( which should be okay)  as he needs close to 3 more weeks  Informed patient Day surgery will call amd make an appt with him

## 2023-12-14 NOTE — Progress Notes (Signed)
 Garrett Frank

## 2023-12-14 NOTE — Patient Instructions (Signed)
 Today, you came in for a follow-up on your lumbar spine infection and IV antibiotic treatment. You were accompanied by your daughter, who is helping with your IV antibiotics. We discussed your back pain, urinary issues, numbness in your fingers, and overall health management.  YOUR PLAN:  -LUMBAR VERTEBRAL OSTEOMYELITIS: Lumbar vertebral osteomyelitis is an infection in the bones of your lower spine. Your back pain has improved, and you should continue your IV ceftriaxone  treatment until December 23rd, 2025. Your daughter will continue to help with the administration of the antibiotics, and we will reassess your condition after you complete the antibiotic course.  -RIGHT UPPER EXTREMITY MONONEUROPATHY: Right upper extremity mononeuropathy is a nerve issue in your right arm, likely caused by the PICC line. This is causing numbness and difficulty with fine motor skills. We have consulted the vascular team about the PICC line placement and potential nerve irritation. If symptoms persist, we may replace the PICC line with a smaller one. You should also start taking vitamin B12 supplements.  -TYPE 2 DIABETES MELLITUS, CONTROLLED: Type 2 diabetes is a condition where your body has trouble regulating blood sugar levels. Your diabetes is well-controlled with your current medications, and your blood sugar levels are stable. Continue with your current diabetes management regimen.  -CHRONIC KIDNEY DISEASE, UNSPECIFIED: Chronic kidney disease is a condition where your kidneys do not function as well as they should. Your recent blood tests show a slight decline in kidney function. To support your kidney health, please increase your water intake.  -HYPERTENSION, CONTROLLED: Hypertension is high blood pressure. Your blood pressure is well-controlled with your current medications. Continue taking your antihypertensive medications as prescribed.  INSTRUCTIONS:  Please continue your IV ceftriaxone  treatment until  December 23rd, 2025. We will reassess your condition after you complete the antibiotic course. Start taking vitamin B12 supplements for your right arm numbness. Increase your water intake to support kidney function. Follow up with us  if you experience any new or worsening symptoms.

## 2023-12-15 ENCOUNTER — Other Ambulatory Visit: Payer: Self-pay

## 2023-12-15 ENCOUNTER — Telehealth: Payer: Self-pay

## 2023-12-15 DIAGNOSIS — M4646 Discitis, unspecified, lumbar region: Secondary | ICD-10-CM

## 2023-12-15 NOTE — Addendum Note (Signed)
 Addended by: VEVA MOTTS T on: 12/15/2023 03:14 PM   Modules accepted: Orders

## 2023-12-15 NOTE — Telephone Encounter (Signed)
 Patient scheduled for picc line replacement on 12/16/23 at Same Day Surgery at [facility] . Patient and patient's daughter aware of appointment information.  Garrett Frank ONEIDA Ligas, CMA

## 2023-12-16 ENCOUNTER — Other Ambulatory Visit: Payer: Self-pay | Admitting: Family

## 2023-12-16 ENCOUNTER — Ambulatory Visit
Admission: RE | Admit: 2023-12-16 | Discharge: 2023-12-16 | Disposition: A | Discharge status: Home / Self Care | Source: Ambulatory Visit | Attending: Infectious Diseases | Admitting: Infectious Diseases

## 2023-12-16 DIAGNOSIS — M4646 Discitis, unspecified, lumbar region: Secondary | ICD-10-CM

## 2023-12-16 NOTE — Progress Notes (Signed)

## 2023-12-17 ENCOUNTER — Encounter: Payer: Self-pay | Admitting: Cardiovascular Disease

## 2024-03-14 ENCOUNTER — Ambulatory Visit: Admitting: Infectious Diseases
